# Patient Record
Sex: Female | Born: 2000 | Race: Black or African American | Hispanic: No | Marital: Single | Smoking: Never smoker
Health system: Southern US, Community
[De-identification: ages and names within clinical notes are randomized; demographics above are authoritative.]

## PROBLEM LIST (undated history)

## (undated) ENCOUNTER — Ambulatory Visit: Payer: Medicaid Other

## (undated) ENCOUNTER — Ambulatory Visit: Admission: EM | Discharge status: Absent without leave | Payer: Medicaid Other

## (undated) DIAGNOSIS — J45909 Unspecified asthma, uncomplicated: Secondary | ICD-10-CM

## (undated) DIAGNOSIS — J302 Other seasonal allergic rhinitis: Secondary | ICD-10-CM

## (undated) HISTORY — PX: TONSILLECTOMY: SUR1361

## (undated) HISTORY — PX: ADENOIDECTOMY: SUR15

---
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
## 2021-04-11 ENCOUNTER — Other Ambulatory Visit: Payer: Self-pay

## 2021-04-11 ENCOUNTER — Ambulatory Visit
Admission: EM | Admit: 2021-04-11 | Discharge: 2021-04-11 | Disposition: A | Discharge status: Home / Self Care | Payer: BC Managed Care – PPO | Attending: Family Medicine | Admitting: Family Medicine

## 2021-04-11 ENCOUNTER — Encounter: Payer: Self-pay | Admitting: Emergency Medicine

## 2021-04-11 DIAGNOSIS — S86001A Unspecified injury of right Achilles tendon, initial encounter: Secondary | ICD-10-CM

## 2021-04-11 NOTE — ED Triage Notes (Addendum)
Was pulling a heavy cart at work behind her when it ran onto the back of her right ankle/heel yesterday. Visible swelling over achilles tendon area, painful active ROM, decreased dorsiflexion d/t pain. Able to walk with little gait abnormality

## 2021-04-11 NOTE — Discharge Instructions (Addendum)
I have ordered a right foot x-ray and attached directions for obtaining this x-ray to your paperwork today.  We will call you with the results if you choose to go get the x-ray taken.  Do not believe that anything is fractured in your foot, but if you would like the x-ray to confirm given the numerous areas of pain on exam, this order has been placed and is available to you.  Take ibuprofen and Tylenol as needed for pain, rest the foot, ice the foot and follow-up if your symptoms are worsening at any time

## 2021-04-11 NOTE — ED Provider Notes (Signed)
[facility]    CSN: [id number] Arrival date & time: 04/11/21  1615      History   Chief Complaint Chief Complaint  Patient presents with   Ankle Injury    HPI Rhonda Sims is a 20 y.o. female.   Patient presenting today with 1 day history of right heel, foot pain since yesterday when a metal cart ran into the back of her foot.  States the area has been swollen, painful, tingly since the incident.  She is able to bear weight on the foot and walk but does limp and has significant pain with walking.  Denies discoloration, numbness, deformity, history of orthopedic injuries to this area.  Took some ibuprofen and has been icing the area with no relief.   Past Medical History:  Diagnosis Date   Asthma    Seasonal allergies     Patient Active Problem List   Diagnosis Date Noted   Obesity due to excess calories without serious comorbidity with body mass index (BMI) in 95th to 98th percentile for age in pediatric patient 06/29/2018   Vitamin D deficiency 06/29/2018   Acanthosis nigricans 06/29/2018    Past Surgical History:  Procedure Laterality Date   ADENOIDECTOMY     TONSILLECTOMY      OB History   No obstetric history on file.      Home Medications    Prior to Admission medications   Medication Sig Start Date End Date Taking? Authorizing Provider  cetirizine (ZYRTEC) 5 MG tablet Take 5 mg by mouth daily.    [provider]  diphenhydrAMINE (BENADRYL) 12.5 MG/5ML elixir Take by mouth 4 (four) times daily as needed.    [provider]  ergocalciferol (VITAMIN D2) 50000 units capsule Take 1 capsule (50,000 Units total) by mouth once a week. 06/30/18   Gretchen Short, NP  ibuprofen (ADVIL,MOTRIN) 600 MG tablet Take 1 tablet (600 mg total) by mouth every 6 (six) hours as needed for mild pain. Patient not taking: Reported on 06/29/2018 06/15/14   Marcellina Millin, MD    Family History Family History  Problem Relation Age of Onset    Stroke Maternal Grandmother    Hypertension Maternal Grandmother    Hyperlipidemia Maternal Grandmother    Diabetes type II Maternal Grandmother    Hypertension Paternal Grandmother    Heart disease Paternal Grandmother     Social History Social History   Tobacco Use   Smoking status: Passive Smoke Exposure - Never Smoker   Smokeless tobacco: Never   Tobacco comments:    mom smokes outside  Substance Use Topics   Alcohol use: No   Drug use: No     Allergies   Peanut-containing drug products   Review of Systems Review of Systems Per HPI  Physical Exam Triage Vital Signs ED Triage Vitals  Enc Vitals Group     BP 04/11/21 1733 (!) 103/37     Pulse Rate 04/11/21 1733 72     Resp 04/11/21 1733 16     Temp 04/11/21 1733 97.8 F (36.6 C)     Temp Source 04/11/21 1733 Oral     SpO2 04/11/21 1733 96 %     Weight --      Height --      Head Circumference --      Peak Flow --      Pain Score 04/11/21 1740 6     Pain Loc --      Pain Edu? --  Excl. in GC? --    No data found.  Updated Vital Signs BP (!) 120/52 (BP Location: Left Arm)   Pulse 72   Temp 97.8 F (36.6 C) (Oral)   Resp 16   LMP 03/28/2021   SpO2 96%   Visual Acuity Right Eye Distance:   Left Eye Distance:   Bilateral Distance:    Right Eye Near:   Left Eye Near:    Bilateral Near:     Physical Exam Vitals and nursing note reviewed.  Constitutional:      Appearance: Normal appearance. She is not ill-appearing.  HENT:     Head: Atraumatic.  Eyes:     Extraocular Movements: Extraocular movements intact.     Conjunctiva/sclera: Conjunctivae normal.  Cardiovascular:     Rate and Rhythm: Normal rate and regular rhythm.     Heart sounds: Normal heart sounds.  Pulmonary:     Effort: Pulmonary effort is normal.     Breath sounds: Normal breath sounds.  Musculoskeletal:        General: Swelling, tenderness and signs of injury present. No deformity. Normal range of motion.      Cervical back: Normal range of motion and neck supple.     Comments: Diffuse trace edema to right Achilles, tenderness to palpation at insertion into the gastrocnemius and calcaneal region.  Patient also endorses tenderness when palpating along medial edge of foot, dorsal aspect of foot.  Range of motion full and intact foot and ankle, though dorsiflexion is eliciting pain  Skin:    General: Skin is warm and dry.     Findings: No bruising or erythema.  Neurological:     Mental Status: She is alert. Mental status is at baseline.     Comments: Right lower extremity neurovascularly intact  Psychiatric:        Mood and Affect: Mood normal.        Thought Content: Thought content normal.        Judgment: Judgment normal.     UC Treatments / Results  Labs (all labs ordered are listed, but only abnormal results are displayed) Labs Reviewed - No data to display  EKG   Radiology No results found.  Procedures Procedures (including critical care time)  Medications Ordered in UC Medications - No data to display  Initial Impression / Assessment and Plan / UC Course  I have reviewed the triage vital signs and the nursing notes.  Pertinent labs & imaging results that were available during my care of the patient were reviewed by me and considered in my medical decision making (see chart for details).     Suspect Achilles strain, no evidence of a full rupture and able to bear weight with fairly normal gait.  Low suspicion for a fracture of the foot or ankle at this time, but will order a right foot x-ray in case pain persisting longer than anticipated.  Instructions given on how patient can obtain his x-ray imaging and we will call him with results once they are available.  Continue over-the-counter pain relievers, RICE protocol.  Work note given.  Follow-up with sports medicine if not resolving.  Final Clinical Impressions(s) / UC Diagnoses   Final diagnoses:  Unspecified injury of right  Achilles tendon, initial encounter     Discharge Instructions      I have ordered a right foot x-ray and attached directions for obtaining this x-ray to your paperwork today.  We will call you with the results if you  choose to go get the x-ray taken.  Do not believe that anything is fractured in your foot, but if you would like the x-ray to confirm given the numerous areas of pain on exam, this order has been placed and is available to you.  Take ibuprofen and Tylenol as needed for pain, rest the foot, ice the foot and follow-up if your symptoms are worsening at any time     ED Prescriptions   None    PDMP not reviewed this encounter.   Particia Nearing, [place] 04/11/21 1830

## 2021-06-09 ENCOUNTER — Other Ambulatory Visit: Payer: Self-pay

## 2021-06-09 ENCOUNTER — Ambulatory Visit
Admission: EM | Admit: 2021-06-09 | Discharge: 2021-06-09 | Disposition: A | Discharge status: Home / Self Care | Payer: Medicaid Other | Attending: Urgent Care | Admitting: Urgent Care

## 2021-06-09 ENCOUNTER — Encounter: Payer: Self-pay | Admitting: *Deleted

## 2021-06-09 DIAGNOSIS — R590 Localized enlarged lymph nodes: Secondary | ICD-10-CM

## 2021-06-09 MED ORDER — NAPROXEN 500 MG PO TABS: 500.0000 mg | ORAL_TABLET | Freq: Two times a day (BID) | ORAL | 0 refills | Status: DC

## 2021-06-09 NOTE — ED Triage Notes (Signed)
Pt reports bump below RT ear has been there for 3-4 days.

## 2021-06-09 NOTE — ED Provider Notes (Signed)
[facility]   MRN: [id number] DOB: Aug 31, 2001  Subjective:   Rhonda Sims is a 20 y.o. female presenting for 3 to 4-day history of acute onset sensation of a knot that is painful over the right upper side of her neck near her jaw.  Denies fever, runny or stuffy nose, sore throat, cough, chest pain, shortness of breath, nausea, vomiting, unexplained fevers, weight loss, rashes, fatigue.  Does not want testing as she does not feel sick.  She last had some blood work done in 2021, refer to the results.  All it showed was mild anemia.  Does not have a PCP.  She does have a history of allergic rhinitis, asthma.  No current facility-administered medications for this encounter.  Current Outpatient Medications:    cetirizine (ZYRTEC) 5 MG tablet, Take 5 mg by mouth daily., Disp: , Rfl:    diphenhydrAMINE (BENADRYL) 12.5 MG/5ML elixir, Take by mouth 4 (four) times daily as needed., Disp: , Rfl:    ergocalciferol (VITAMIN D2) 50000 units capsule, Take 1 capsule (50,000 Units total) by mouth once a week., Disp: 12 capsule, Rfl: 1   ibuprofen (ADVIL,MOTRIN) 600 MG tablet, Take 1 tablet (600 mg total) by mouth every 6 (six) hours as needed for mild pain. (Patient not taking: Reported on 06/29/2018), Disp: 30 tablet, Rfl: 0   Allergies  Allergen Reactions   Peanut-Containing Drug Products Itching    Past Medical History:  Diagnosis Date   Asthma    Seasonal allergies      Past Surgical History:  Procedure Laterality Date   ADENOIDECTOMY     TONSILLECTOMY      Family History  Problem Relation Age of Onset   Stroke Maternal Grandmother    Hypertension Maternal Grandmother    Hyperlipidemia Maternal Grandmother    Diabetes type II Maternal Grandmother    Hypertension Paternal Grandmother    Heart disease Paternal Grandmother     Social History   Tobacco Use   Smoking status: Passive Smoke Exposure - Never Smoker   Smokeless tobacco: Never   Tobacco comments:     mom smokes outside  Substance Use Topics   Alcohol use: No   Drug use: No    ROS   Objective:   Vitals: BP 103/71   Pulse 71   Temp 98.4 F (36.9 C)   Resp 18   LMP  (LMP Unknown)   SpO2 98%   Physical Exam Constitutional:      General: She is not in acute distress.    Appearance: Normal appearance. She is well-developed. She is not ill-appearing, toxic-appearing or diaphoretic.  HENT:     Head: Normocephalic and atraumatic.     Right Ear: Tympanic membrane, ear canal and external ear normal. No drainage or tenderness. No middle ear effusion. There is no impacted cerumen. Tympanic membrane is not erythematous.     Left Ear: Tympanic membrane, ear canal and external ear normal. No drainage or tenderness.  No middle ear effusion. There is no impacted cerumen. Tympanic membrane is not erythematous.     Nose: Nose normal. No congestion or rhinorrhea.     Mouth/Throat:     Mouth: Mucous membranes are moist. No oral lesions.     Pharynx: Oropharynx is clear. No pharyngeal swelling, oropharyngeal exudate, posterior oropharyngeal erythema or uvula swelling.     Tonsils: No tonsillar exudate or tonsillar abscesses.  Eyes:     General: No scleral icterus.       Right eye: No  discharge.        Left eye: No discharge.     Extraocular Movements: Extraocular movements intact.     Right eye: Normal extraocular motion.     Left eye: Normal extraocular motion.     Conjunctiva/sclera: Conjunctivae normal.     Pupils: Pupils are equal, round, and reactive to light.  Cardiovascular:     Rate and Rhythm: Normal rate.  Pulmonary:     Effort: Pulmonary effort is normal.  Musculoskeletal:     Cervical back: Normal range of motion and neck supple.  Lymphadenopathy:     Head:     Right side of head: No submental, submandibular, tonsillar, preauricular, posterior auricular or occipital adenopathy.     Left side of head: No submental, submandibular, tonsillar, preauricular, posterior auricular  or occipital adenopathy.     Cervical: Cervical adenopathy present.     Right cervical: Superficial cervical adenopathy (superior, right sided near the end of her jawline) present. No deep or posterior cervical adenopathy.    Left cervical: No superficial, deep or posterior cervical adenopathy.     Upper Body:     Right upper body: No supraclavicular, axillary or epitrochlear adenopathy.     Left upper body: No supraclavicular, axillary or epitrochlear adenopathy.  Skin:    General: Skin is warm and dry.  Neurological:     General: No focal deficit present.     Mental Status: She is alert and oriented to person, place, and time.  Psychiatric:        Mood and Affect: Mood normal.        Behavior: Behavior normal.        Thought Content: Thought content normal.        Judgment: Judgment normal.    Assessment and Plan :   PDMP not reviewed this encounter.  1. Cervical lymphadenopathy     Offered patient testing but she refused.  Discussed lymphadenopathy including etiologies.  Recommended conservative management, naproxen for pain and inflammation. Counseled patient on potential for adverse effects with medications prescribed/recommended today, ER and return-to-clinic precautions discussed, patient verbalized understanding.    Wallis Bamberg, [place] 06/09/21 1542

## 2022-01-02 NOTE — Progress Notes (Signed)
?Subjective:  ? ? Rhonda Sims - 21 y.o. female MRN [id number]  Date of birth: December 06, 2000 ? ?HPI ? ?Rhonda Sims is to establish care and annual physical exam. ? ?Current issues and/or concerns: ?Irregular periods: ?Reports since the pandemic periods have changed. Currently having a period for 3 months. Then will not have a period for 3 months. Reports periods were irregular prior to pandemic but has worsened since then. Was prescribed oral birth control pill which did not help. Not currently taking any birth control.  ? ?2. Stress: ?Reports primarily related to interactions she has with older men. States she rides public transportation (bus). Reports one time while walking home a 20 year-old man asked for her phone number. She became frightened and gave the man her mothers phone number. Reports she tells men that even though she may look older she is only 21 years-old. Reports the men tell her that it is essentially ok. Reports men have followed her trying to talk to her and get her attention. Reports she loves her family but doesn't really consider them a support system. States she prefers to stay alone in her bedroom because she doesn't feel she can be herself around them. Has considered that the world is going to end one day. Reports she isn't afraid to die. Denies thoughts of self-harm, suicidal ideations, and homicidal ideations. Reports she is not interested in counseling or medication as of present. Reports she prefers to fix what's going on in the inside instead of getting addicted to medication.  ? ?3. Weight: ?Reports would like to lose some weigh. Reports doesn't have much of an appetite. Usually eating once daily at nighttime. Walking for exercise when the weather is warm.  ? ? ?  01/07/2022  ?  8:44 AM  ?Depression screen PHQ 2/9  ?Decreased Interest 1  ?Down, Depressed, Hopeless 1  ?PHQ - 2 Score 2  ?Altered sleeping 2  ?Tired, decreased energy 2  ?Change in appetite 2  ?Feeling bad or  failure about yourself  2  ?Trouble concentrating 2  ?Moving slowly or fidgety/restless 1  ?Suicidal thoughts 0  ?PHQ-9 Score 13  ?Difficult doing work/chores Somewhat difficult  ? ? ?ROS per HPI  ? ? ? ?Health Maintenance:  ?Health Maintenance Due  ?Topic Date Due  ? COVID-19 Vaccine (1) Never done  ? HPV VACCINES (1 - 2-dose series) Never done  ? Hepatitis C Screening  Never done  ? TETANUS/TDAP  Never done  ? ? ? ?Past Medical History: ?Patient Active Problem List  ? Diagnosis Date Noted  ? Obesity due to excess calories without serious comorbidity with body mass index (BMI) in 95th to 98th percentile for age in pediatric patient 06/29/2018  ? Vitamin D deficiency 06/29/2018  ? Acanthosis nigricans 06/29/2018  ? ? ? ? ?Social History  ? reports that she has never smoked. She has been exposed to tobacco smoke. She has never used smokeless tobacco. She reports that she does not drink alcohol and does not use drugs.  ? ?Family History  ?family history includes Diabetes type II in her maternal grandmother; Heart disease in her paternal grandmother; Hyperlipidemia in her maternal grandmother; Hypertension in her maternal grandmother and paternal grandmother; Stroke in her maternal grandmother.  ? ?Medications: reviewed and updated ?  ?Objective:  ? Physical Exam ?BP 108/71 (BP Location: Left Arm, Patient Position: Sitting, Cuff Size: Large)   Pulse 83   Temp 98.3 ?F (36.8 ?C)   Resp 18  Ht 5' 4.37" (1.635 m)   Wt 275 lb (124.7 kg)   SpO2 98%   BMI 46.66 kg/m?  ? ?Physical Exam ?HENT:  ?   Head: Normocephalic and atraumatic.  ?   Right Ear: Tympanic membrane, ear canal and external ear normal.  ?   Left Ear: Tympanic membrane, ear canal and external ear normal.  ?   Nose: Nose normal.  ?   Mouth/Throat:  ?   Mouth: Mucous membranes are moist.  ?   Pharynx: Oropharynx is clear.  ?Eyes:  ?   Extraocular Movements: Extraocular movements intact.  ?   Conjunctiva/sclera: Conjunctivae normal.  ?   Pupils: Pupils are  equal, round, and reactive to light.  ?Cardiovascular:  ?   Rate and Rhythm: Normal rate and regular rhythm.  ?   Pulses: Normal pulses.  ?   Heart sounds: Normal heart sounds.  ?Pulmonary:  ?   Effort: Pulmonary effort is normal.  ?   Breath sounds: Normal breath sounds.  ?Chest:  ?   Comments: Patient declined.  ?Abdominal:  ?   General: Bowel sounds are normal.  ?   Palpations: Abdomen is soft.  ?Genitourinary: ?   Comments: Patient declined.  ?Musculoskeletal:     ?   General: Normal range of motion.  ?   Cervical back: Normal range of motion and neck supple.  ?Skin: ?   General: Skin is warm and dry.  ?   Capillary Refill: Capillary refill takes less than 2 seconds.  ?Neurological:  ?   General: No focal deficit present.  ?   Mental Status: She is alert and oriented to person, place, and time.  ?Psychiatric:     ?   Mood and Affect: Mood normal.     ?   Behavior: Behavior normal.  ? ?   ?Assessment & Plan:  ?1. Encounter to establish care: ?2. Annual physical exam: ?- Counseled on 150 minutes of exercise per week as tolerated, healthy eating (including decreased daily intake of saturated fats, cholesterol, added sugars, sodium), STI prevention, and routine healthcare maintenance. ? ?3. Screening for metabolic disorder: ?- TIR44+RXVQ to check kidney function, liver function, and electrolyte balance.  ?- CMP14+EGFR ? ?4. Screening for deficiency anemia: ?- CBC to screen for anemia. ?- CBC ? ?5. Diabetes mellitus screening: ?- Hemoglobin A1c to screen for pre-diabetes/diabetes. ?- Hemoglobin A1c ? ?6. Screening cholesterol level: ?- Lipid panel to screen for high cholesterol.  ?- Lipid panel ? ?7. Thyroid disorder screen: ?- TSH to check thyroid function.  ?- TSH ? ?8. Need for hepatitis C screening test: ?- Hepatitis C antibody to screen for hepatitis C.  ?- Hepatitis C Antibody ? ?9. Encounter for screening for HIV: ?- HIV antibody to screen for human immunodeficiency virus.  ?- HIV antibody (with reflex) ? ?10.  Irregular menses: ?- Referral to Gynecology for further evaluation and management. ?- Ambulatory referral to Gynecology ? ?11. Anxiety and depression: ?- Patient denies thoughts of self-harm, suicidal ideations, homicidal ideations. ?- Patient declined pharmacological therapy.  ?- Patient declined referral to Psychiatry/counseling.  ?- Follow-up with primary provider as scheduled.  ? ? ? ?Patient was given clear instructions to go to Emergency Department or return to medical center if symptoms don't improve, worsen, or new problems develop.The patient verbalized understanding. ? ?I discussed the assessment and treatment plan with the patient. The patient was provided an opportunity to ask questions and all were answered. The patient agreed with the plan and demonstrated  an understanding of the instructions. ?  ?The patient was advised to call back or seek an in-person evaluation if the symptoms worsen or if the condition fails to improve as anticipated. ? ? ? ?Durene Fruits, NP ?01/07/2022, 10:15 AM ?Primary Care at [facility]  ? ?

## 2022-01-07 ENCOUNTER — Ambulatory Visit (INDEPENDENT_AMBULATORY_CARE_PROVIDER_SITE_OTHER): Payer: Medicaid Other | Admitting: Family

## 2022-01-07 ENCOUNTER — Encounter: Payer: Self-pay | Admitting: Family

## 2022-01-07 VITALS — BP 108/71 | HR 83 | Temp 98.3°F | Resp 18 | Ht 64.37 in | Wt 275.0 lb

## 2022-01-07 DIAGNOSIS — Z13228 Encounter for screening for other metabolic disorders: Secondary | ICD-10-CM | POA: Diagnosis not present

## 2022-01-07 DIAGNOSIS — Z7689 Persons encountering health services in other specified circumstances: Secondary | ICD-10-CM | POA: Diagnosis not present

## 2022-01-07 DIAGNOSIS — N926 Irregular menstruation, unspecified: Secondary | ICD-10-CM

## 2022-01-07 DIAGNOSIS — F419 Anxiety disorder, unspecified: Secondary | ICD-10-CM

## 2022-01-07 DIAGNOSIS — Z114 Encounter for screening for human immunodeficiency virus [HIV]: Secondary | ICD-10-CM

## 2022-01-07 DIAGNOSIS — Z0000 Encounter for general adult medical examination without abnormal findings: Secondary | ICD-10-CM

## 2022-01-07 DIAGNOSIS — Z130 Encounter for screening for diseases of the blood and blood-forming organs and certain disorders involving the immune mechanism: Secondary | ICD-10-CM | POA: Diagnosis not present

## 2022-01-07 DIAGNOSIS — Z131 Encounter for screening for diabetes mellitus: Secondary | ICD-10-CM

## 2022-01-07 DIAGNOSIS — F32A Depression, unspecified: Secondary | ICD-10-CM

## 2022-01-07 DIAGNOSIS — Z1159 Encounter for screening for other viral diseases: Secondary | ICD-10-CM

## 2022-01-07 DIAGNOSIS — Z13220 Encounter for screening for lipoid disorders: Secondary | ICD-10-CM

## 2022-01-07 DIAGNOSIS — Z1329 Encounter for screening for other suspected endocrine disorder: Secondary | ICD-10-CM

## 2022-01-07 NOTE — Progress Notes (Signed)
Pt presents to establish care has concerns about menstrual cycle being irregular comes on and stays for 3 months and then goes off and another 3 months it stays off denies any pain or cramping ?Having some stressful issues  ?

## 2022-01-07 NOTE — Patient Instructions (Signed)
Preventive Care 20-21 Years Old, Female Preventive care refers to lifestyle choices and visits with your health care provider that can promote health and wellness. At this stage in your life, you may start seeing a primary care physician instead of a pediatrician for your preventive care. Preventive care visits are also called wellness exams. What can I expect for my preventive care visit? Counseling During your preventive care visit, your health care provider may ask about your: Medical history, including: Past medical problems. Family medical history. Pregnancy history. Current health, including: Menstrual cycle. Method of birth control. Emotional well-being. Home life and relationship well-being. Sexual activity and sexual health. Lifestyle, including: Alcohol, nicotine or tobacco, and drug use. Access to firearms. Diet, exercise, and sleep habits. Sunscreen use. Motor vehicle safety. Physical exam Your health care provider may check your: Height and weight. These may be used to calculate your BMI (body mass index). BMI is a measurement that tells if you are at a healthy weight. Waist circumference. This measures the distance around your waistline. This measurement also tells if you are at a healthy weight and may help predict your risk of certain diseases, such as type 2 diabetes and high blood pressure. Heart rate and blood pressure. Body temperature. Skin for abnormal spots. Breasts. What immunizations do I need?  Vaccines are usually given at various ages, according to a schedule. Your health care provider will recommend vaccines for you based on your age, medical history, and lifestyle or other factors, such as travel or where you work. What tests do I need? Screening Your health care provider may recommend screening tests for certain conditions. This may include: Vision and hearing tests. Lipid and cholesterol levels. Pelvic exam and Pap test. Hepatitis B  test. Hepatitis C test. HIV (human immunodeficiency virus) test. STI (sexually transmitted infection) testing, if you are at risk. Tuberculosis skin test if you have symptoms. BRCA-related cancer screening. This may be done if you have a family history of breast, ovarian, tubal, or peritoneal cancers. Talk with your health care provider about your test results, treatment options, and if necessary, the need for more tests. Follow these instructions at home: Eating and drinking  Eat a healthy diet that includes fresh fruits and vegetables, whole grains, lean protein, and low-fat dairy products. Drink enough fluid to keep your urine pale yellow. Do not drink alcohol if: Your health care provider tells you not to drink. You are pregnant, may be pregnant, or are planning to become pregnant. You are under the legal drinking age. In the [place] the legal drinking age is 21. If you drink alcohol: Limit how much you have to 20-1 drink a day. Know how much alcohol is in your drink. In the [place] one drink equals one 12 oz bottle of beer (355 mL), one 5 oz glass of wine (148 mL), or one 1 oz glass of hard liquor (44 mL). Lifestyle Brush your teeth every morning and night with fluoride toothpaste. Floss one time each day. Exercise for at least 30 minutes 5 or more days of the week. Do not use any products that contain nicotine or tobacco. These products include cigarettes, chewing tobacco, and vaping devices, such as e-cigarettes. If you need help quitting, ask your health care provider. Do not use drugs. If you are sexually active, practice safe sex. Use a condom or other form of protection to prevent STIs. If you do not wish to become pregnant, use a form of birth control. If you plan to become pregnant,   see your health care provider for a prepregnancy visit. Find healthy ways to manage stress, such as: Meditation, yoga, or listening to music. Journaling. Talking to a trusted person. Spending time  with friends and family. Safety Always wear your seat belt while driving or riding in a vehicle. Do not drive: If you have been drinking alcohol. Do not ride with someone who has been drinking. When you are tired or distracted. While texting. If you have been using any mind-altering substances or drugs. Wear a helmet and other protective equipment during sports activities. If you have firearms in your house, make sure you follow all gun safety procedures. Seek help if you have been bullied, physically abused, or sexually abused. Use the internet responsibly to avoid dangers, such as online bullying and online sex predators. What's next? Go to your health care provider once a year for an annual wellness visit. Ask your health care provider how often you should have your eyes and teeth checked. Stay up to date on all vaccines. This information is not intended to replace advice given to you by your health care provider. Make sure you discuss any questions you have with your health care provider. Document Revised: 02/20/2021 Document Reviewed: 02/20/2021 Elsevier Patient Education  2023 Elsevier Inc.  

## 2022-01-08 LAB — TSH: TSH: 3 u[IU]/mL (ref 0.450–4.500)

## 2022-01-08 LAB — CMP14+EGFR
ALT: 8 IU/L (ref 0–32)
AST: 11 IU/L (ref 0–40)
Albumin/Globulin Ratio: 1.5 (ref 1.2–2.2)
Albumin: 4.3 g/dL (ref 3.9–5.0)
Alkaline Phosphatase: 102 IU/L (ref 42–106)
BUN/Creatinine Ratio: 14 (ref 9–23)
BUN: 11 mg/dL (ref 6–20)
Bilirubin Total: <0.2 mg/dL (ref 0.0–1.2)
CO2: 21 mmol/L (ref 20–29)
Calcium: 9.7 mg/dL (ref 8.7–10.2)
Chloride: 104 mmol/L (ref 96–106)
Creatinine, Ser: 0.81 mg/dL (ref 0.57–1.00)
Globulin, Total: 2.9 g/dL (ref 1.5–4.5)
Glucose: 91 mg/dL (ref 70–99)
Potassium: 4.7 mmol/L (ref 3.5–5.2)
Sodium: 141 mmol/L (ref 134–144)
Total Protein: 7.2 g/dL (ref 6.0–8.5)
eGFR: 107 mL/min/{1.73_m2} (ref >59)

## 2022-01-08 LAB — HEMOGLOBIN A1C
Est. average glucose Bld gHb Est-mCnc: 108 mg/dL
Hgb A1c MFr Bld: 5.4 % (ref 4.8–5.6)

## 2022-01-08 LAB — CBC
Hematocrit: 32.5 % — ABNORMAL LOW (ref 34.0–46.6)
Hemoglobin: 9.9 g/dL — ABNORMAL LOW (ref 11.1–15.9)
MCH: 22.1 pg — ABNORMAL LOW (ref 26.6–33.0)
MCHC: 30.5 g/dL — ABNORMAL LOW (ref 31.5–35.7)
MCV: 73 fL — ABNORMAL LOW (ref 79–97)
Platelets: 543 10*3/uL — ABNORMAL HIGH (ref 150–450)
RBC: 4.48 x10E6/uL (ref 3.77–5.28)
RDW: 14.1 % (ref 11.7–15.4)
WBC: 8.8 10*3/uL (ref 3.4–10.8)

## 2022-01-08 LAB — HEPATITIS C ANTIBODY: Hep C Virus Ab: NONREACTIVE

## 2022-01-08 LAB — LIPID PANEL
Chol/HDL Ratio: 3 ratio (ref 0.0–4.4)
Cholesterol, Total: 132 mg/dL (ref 100–169)
HDL: 44 mg/dL (ref >39)
LDL Chol Calc (NIH): 68 mg/dL (ref 0–109)
Triglycerides: 107 mg/dL — ABNORMAL HIGH (ref 0–89)
VLDL Cholesterol Cal: 20 mg/dL (ref 5–40)

## 2022-01-08 LAB — HIV ANTIBODY (ROUTINE TESTING W REFLEX): HIV Screen 4th Generation wRfx: NONREACTIVE

## 2022-01-08 NOTE — Progress Notes (Signed)
-    Kidney function and electrolytes normal.  ?-  Liver function normal.  ?-  Thyroid function normal. ?-  No diabetes.  ?-  Hepatitis C negative.  ?-  HIV negative.  ? ?The following abnormalities are noted:   ?-  Triglycerides, a form of cholesterol, mildly higher than expected. This may increase risk of heart attack and/or stroke. Consider eating more fruits, vegetables, and lean baked meats such as chicken or fish. Moderate intensity exercise at least 150 minutes as tolerated per week may help as well.  ?-  Anemia likely related to irregular periods. ? ?All other values are normal, stable or within acceptable limits. ?Medication changes / Follow up labs / Other changes or recommendations:   ?-   Recheck fasting cholesterol in 3 to 6 months.  ?-  Keep all scheduled appointments with Gynecology for irregular periods and anemia.  ? ?Rhonda Herter, NP 01/08/2022 7:47 AM  ?

## 2022-01-16 ENCOUNTER — Ambulatory Visit
Admission: EM | Admit: 2022-01-16 | Discharge: 2022-01-16 | Disposition: A | Discharge status: Home / Self Care | Payer: Medicaid Other | Attending: Student | Admitting: Student

## 2022-01-16 DIAGNOSIS — J4521 Mild intermittent asthma with (acute) exacerbation: Secondary | ICD-10-CM | POA: Diagnosis not present

## 2022-01-16 DIAGNOSIS — J029 Acute pharyngitis, unspecified: Secondary | ICD-10-CM

## 2022-01-16 LAB — POCT RAPID STREP A (OFFICE): Rapid Strep A Screen: NEGATIVE

## 2022-01-16 MED ORDER — ONDANSETRON 8 MG PO TBDP: 8.0000 mg | ORAL_TABLET | Freq: Three times a day (TID) | ORAL | 0 refills | Status: DC | PRN

## 2022-01-16 MED ORDER — ALBUTEROL SULFATE HFA 108 (90 BASE) MCG/ACT IN AERS: 1.0000 | INHALATION_SPRAY | Freq: Four times a day (QID) | RESPIRATORY_TRACT | 0 refills | Status: DC | PRN

## 2022-01-16 MED ORDER — LIDOCAINE VISCOUS HCL 2 % MT SOLN: 15.0000 mL | OROMUCOSAL | 0 refills | Status: DC | PRN

## 2022-01-16 NOTE — Discharge Instructions (Addendum)
-  Albuterol inhaler as needed for cough, wheezing, shortness of breath, 1 to 2 puffs every 6 hours as needed. ?-For sore throat, use lidocaine mouthwash up to every 4 hours. Make sure not to eat for at least 1 hour after using this, as your mouth will be very numb and you could bite yourself. ?-You can take Tylenol up to 1000 mg 3 times daily, and ibuprofen up to 600 mg 3 times daily with food.  You can take these together, or alternate every 3-4 hours. ?-Continue over-the-counter medications like Nyquil ?-With a virus, you're typically contagious for 5-7 days, or as long as you're having fevers.  ? ?

## 2022-01-16 NOTE — ED Provider Notes (Signed)
?[facility] ? ? ? ?CSN: [id number] ?Arrival date & time: 01/16/22  1313 ? ? ?  ? ?History   ?Chief Complaint ?Chief Complaint  ?Patient presents with  ? Sore Throat  ? ? ?HPI ?Rhonda Sims is a 21 y.o. female presenting with sore throat, headache, nasal congestion, bilateral ear pressure for 3 days.  History of tonsillectomy and childhood asthma, this has been controlled on no inhalers for years.  States sore throat is 4/10 during the day, but 10/10 at night.  Describes ear pressure without hearing changes, dizziness, tinnitus. One episode of bilious vomiting today. She has attempted NyQuil and an over-the-counter cold and flu medicine during the day.  Has not monitored temperature at home, but endorses subjective chills and myalgias. Minimal cough. Denies SOB, CP, dizziness, weakness, trouble swallowing. ? ?HPI ? ?Past Medical History:  ?Diagnosis Date  ? Asthma   ? Seasonal allergies   ? ? ?Patient Active Problem List  ? Diagnosis Date Noted  ? Anxiety and depression 01/07/2022  ? Obesity due to excess calories without serious comorbidity with body mass index (BMI) in 95th to 98th percentile for age in pediatric patient 06/29/2018  ? Vitamin D deficiency 06/29/2018  ? Acanthosis nigricans 06/29/2018  ? ? ?Past Surgical History:  ?Procedure Laterality Date  ? ADENOIDECTOMY    ? TONSILLECTOMY    ? ? ?OB History   ?No obstetric history on file. ?  ? ? ? ?Home Medications   ? ?Prior to Admission medications   ?Medication Sig Start Date End Date Taking? Authorizing Provider  ?albuterol (VENTOLIN HFA) 108 (90 Base) MCG/ACT inhaler Inhale 1-2 puffs into the lungs every 6 (six) hours as needed for wheezing or shortness of breath. 01/16/22  Yes Rhys Martini, PA-C  ?lidocaine (XYLOCAINE) 2 % solution Use as directed 15 mLs in the mouth or throat every 4 (four) hours as needed for mouth pain. Swish/gargle and spit 01/16/22  Yes Rhys Martini, PA-C  ? ? ?Family History ?Family History  ?Problem Relation  Age of Onset  ? Stroke Maternal Grandmother   ? Hypertension Maternal Grandmother   ? Hyperlipidemia Maternal Grandmother   ? Diabetes type II Maternal Grandmother   ? Hypertension Paternal Grandmother   ? Heart disease Paternal Grandmother   ? ? ?Social History ?Social History  ? ?Tobacco Use  ? Smoking status: Never  ?  Passive exposure: Yes  ? Smokeless tobacco: Never  ? Tobacco comments:  ?  mom smokes outside  ?Vaping Use  ? Vaping Use: Never used  ?Substance Use Topics  ? Alcohol use: No  ? Drug use: No  ? ? ? ?Allergies   ?Peanut-containing drug products ? ? ?Review of Systems ?Review of Systems  ?Constitutional:  Positive for chills. Negative for appetite change and fever.  ?HENT:  Positive for congestion and sore throat. Negative for ear pain, rhinorrhea, sinus pressure and sinus pain.   ?Eyes:  Negative for redness and visual disturbance.  ?Respiratory:  Negative for cough, chest tightness, shortness of breath and wheezing.   ?Cardiovascular:  Negative for chest pain and palpitations.  ?Gastrointestinal:  Negative for abdominal pain, constipation, diarrhea, nausea and vomiting.  ?Genitourinary:  Negative for dysuria, frequency and urgency.  ?Musculoskeletal:  Negative for myalgias.  ?Neurological:  Negative for dizziness, weakness and headaches.  ?Psychiatric/Behavioral:  Negative for confusion.   ?All other systems reviewed and are negative. ? ? ?Physical Exam ?Triage Vital Signs ?ED Triage Vitals  ?Enc Vitals Group  ?  BP 01/16/22 1322 116/82  ?   Pulse Rate 01/16/22 1322 100  ?   Resp 01/16/22 1322 18  ?   Temp 01/16/22 1322 98.5 ?F (36.9 ?C)  ?   Temp Source 01/16/22 1322 Oral  ?   SpO2 01/16/22 1322 98 %  ?   Weight --   ?   Height --   ?   Head Circumference --   ?   Peak Flow --   ?   Pain Score 01/16/22 1320 6  ?   Pain Loc --   ?   Pain Edu? --   ?   Excl. in GC? --   ? ?No data found. ? ?Updated Vital Signs ?BP 116/82 (BP Location: Right Arm)   Pulse 100   Temp 98.5 ?F (36.9 ?C) (Oral)   Resp  18   LMP 11/27/2021 (Approximate)   SpO2 98%  ? ?Visual Acuity ?Right Eye Distance:   ?Left Eye Distance:   ?Bilateral Distance:   ? ?Right Eye Near:   ?Left Eye Near:    ?Bilateral Near:    ? ?Physical Exam ?Vitals reviewed.  ?Constitutional:   ?   General: She is not in acute distress. ?   Appearance: Normal appearance. She is not ill-appearing.  ?HENT:  ?   Head: Normocephalic and atraumatic.  ?   Right Ear: Tympanic membrane, ear canal and external ear normal. No tenderness. No middle ear effusion. There is no impacted cerumen. Tympanic membrane is not perforated, erythematous, retracted or bulging.  ?   Left Ear: Tympanic membrane, ear canal and external ear normal. No tenderness.  No middle ear effusion. There is no impacted cerumen. Tympanic membrane is not perforated, erythematous, retracted or bulging.  ?   Nose: Nose normal. No congestion.  ?   Mouth/Throat:  ?   Mouth: Mucous membranes are moist.  ?   Pharynx: Uvula midline. Posterior oropharyngeal erythema present. No oropharyngeal exudate.  ?   Tonsils: 0 on the right. 0 on the left.  ?   Comments: Bright erythema posterior pharynx and uvula. Tonsils are absent. On exam, uvula is midline, she is tolerating her secretions without difficulty, there is no trismus, no drooling. Voice is hoarse, but no muffled or hot potato voice. ? ?Eyes:  ?   Extraocular Movements: Extraocular movements intact.  ?   Pupils: Pupils are equal, round, and reactive to light.  ?Cardiovascular:  ?   Rate and Rhythm: Normal rate and regular rhythm.  ?   Heart sounds: Normal heart sounds.  ?Pulmonary:  ?   Effort: Pulmonary effort is normal.  ?   Breath sounds: Wheezing present. No decreased breath sounds, rhonchi or rales.  ?   Comments: Exam limited due to body habitus. Occ wheezes throughout ?Abdominal:  ?   Palpations: Abdomen is soft.  ?   Tenderness: There is no abdominal tenderness. There is no guarding or rebound.  ?Lymphadenopathy:  ?   Cervical: No cervical adenopathy.   ?   Right cervical: No superficial cervical adenopathy. ?   Left cervical: No superficial cervical adenopathy.  ?Neurological:  ?   General: No focal deficit present.  ?   Mental Status: She is alert and oriented to person, place, and time.  ?Psychiatric:     ?   Mood and Affect: Mood normal.     ?   Behavior: Behavior normal.     ?   Thought Content: Thought content normal.     ?  Judgment: Judgment normal.  ? ? ? ?UC Treatments / Results  ?Labs ?(all labs ordered are listed, but only abnormal results are displayed) ?Labs Reviewed  ?CULTURE, GROUP A STREP [facility])  ?POCT RAPID STREP A (OFFICE)  ? ? ?EKG ? ? ?Radiology ?No results found. ? ?Procedures ?Procedures (including critical care time) ? ?Medications Ordered in UC ?Medications - No data to display ? ?Initial Impression / Assessment and Plan / UC Course  ?I have reviewed the triage vital signs and the nursing notes. ? ?Pertinent labs & imaging results that were available during my care of the patient were reviewed by me and considered in my medical decision making (see chart for details). ? ?  ? ?This patient is a very pleasant 21 y.o. year old female presenting with viral pharyngitis and reactive airway exacerbation. Afebrile, borderline tachy.  ? ?Rapid strep negative, culture sent.  ? ?Viscous lidocaine, albuterol inhaler sent. Continue OTC medications. ? ?ED return precautions discussed. Patient verbalizes understanding and agreement.  ? ? ?Final Clinical Impressions(s) / UC Diagnoses  ? ?Final diagnoses:  ?Viral pharyngitis  ?Mild intermittent reactive airway disease with acute exacerbation  ? ? ? ?Discharge Instructions   ? ?  ?-Albuterol inhaler as needed for cough, wheezing, shortness of breath, 1 to 2 puffs every 6 hours as needed. ?-For sore throat, use lidocaine mouthwash up to every 4 hours. Make sure not to eat for at least 1 hour after using this, as your mouth will be very numb and you could bite yourself. ?-You can take Tylenol up to 1000 mg 3  times daily, and ibuprofen up to 600 mg 3 times daily with food.  You can take these together, or alternate every 3-4 hours. ?-Continue over-the-counter medications like Nyquil ?-With a virus, you're t

## 2022-01-16 NOTE — ED Triage Notes (Addendum)
Patient presents to Urgent Care with complaints of sore throat, ha, sinus pain, otalgia since Tuesday. Patient reports some fevers, pt reports otc cold and cough medications.  ? ?Pmh of asthma pt reports sob and wheezing.  ?No inhaler at home ? ? ? ?

## 2022-01-17 LAB — COVID-19, FLU A+B AND RSV
Influenza A, NAA: NOT DETECTED
Influenza B, NAA: NOT DETECTED
RSV, NAA: NOT DETECTED
SARS-CoV-2, NAA: NOT DETECTED

## 2022-01-18 LAB — CULTURE, GROUP A STREP (THRC)

## 2022-03-13 NOTE — Progress Notes (Deleted)
Patient ID: DYNVER CLEMSON, female    DOB: Jul 22, 2001  MRN: [id number]  CC: Issues with cycle  Subjective: Rhonda Sims is a 21 y.o. female who presents for issues with cycle.   Her concerns today include:  Irregular menses  01/07/2022 HPI Reports since the pandemic periods have changed. Currently having a period for 3 months. Then will not have a period for 3 months. Reports periods were irregular prior to pandemic but has worsened since then. Was prescribed oral birth control pill which did not help. Not currently taking any birth control.   A&P - Referral to Gynecology for further evaluation and management.  Per Arna Medici 01/07/2022 Note:   Placed in [facility]  [address]  [place], [place] [address] [phone]  03/21/2022   Patient Active Problem List   Diagnosis Date Noted   Anxiety and depression 01/07/2022   Obesity due to excess calories without serious comorbidity with body mass index (BMI) in 95th to 98th percentile for age in pediatric patient 06/29/2018   Vitamin D deficiency 06/29/2018   Acanthosis nigricans 06/29/2018     Current Outpatient Medications on File Prior to Visit  Medication Sig Dispense Refill   albuterol (VENTOLIN HFA) 108 (90 Base) MCG/ACT inhaler Inhale 1-2 puffs into the lungs every 6 (six) hours as needed for wheezing or shortness of breath. 1 each 0   lidocaine (XYLOCAINE) 2 % solution Use as directed 15 mLs in the mouth or throat every 4 (four) hours as needed for mouth pain. Swish/gargle and spit 100 mL 0   ondansetron (ZOFRAN-ODT) 8 MG disintegrating tablet Take 1 tablet (8 mg total) by mouth every 8 (eight) hours as needed for nausea or vomiting. 20 tablet 0   No current facility-administered medications on file prior to visit.    Allergies  Allergen Reactions   Peanut-Containing Drug Products Itching    Social History   Socioeconomic History   Marital status: Single    Spouse name: Not on file    Number of children: Not on file   Years of education: Not on file   Highest education level: Not on file  Occupational History   Not on file  Tobacco Use   Smoking status: Never    Passive exposure: Yes   Smokeless tobacco: Never   Tobacco comments:    mom smokes outside  Vaping Use   Vaping Use: Never used  Substance and Sexual Activity   Alcohol use: No   Drug use: No   Sexual activity: Never  Other Topics Concern   Not on file  Social History Narrative   Lives with 2 cousins, brother, and mom   Social Determinants of Health   Financial Resource Strain: Not on file  Food Insecurity: Not on file  Transportation Needs: Not on file  Physical Activity: Not on file  Stress: Not on file  Social Connections: Not on file  Intimate Partner Violence: Not on file    Family History  Problem Relation Age of Onset   Stroke Maternal Grandmother    Hypertension Maternal Grandmother    Hyperlipidemia Maternal Grandmother    Diabetes type II Maternal Grandmother    Hypertension Paternal Grandmother    Heart disease Paternal Grandmother     Past Surgical History:  Procedure Laterality Date   ADENOIDECTOMY     TONSILLECTOMY      ROS: Review of Systems Negative except as stated above  PHYSICAL EXAM: There were no vitals taken  for this visit.  Physical Exam  {female adult master:310786} {female adult master:[id number]}     Latest Ref Rng & Units 01/07/2022    9:39 AM  CMP  Glucose 70 - 99 mg/dL 91   BUN 6 - 20 mg/dL 11   Creatinine 5.36 - 1.00 mg/dL 6.44   Sodium 034 - 742 mmol/L 141   Potassium 3.5 - 5.2 mmol/L 4.7   Chloride 96 - 106 mmol/L 104   CO2 20 - 29 mmol/L 21   Calcium 8.7 - 10.2 mg/dL 9.7   Total Protein 6.0 - 8.5 g/dL 7.2   Total Bilirubin 0.0 - 1.2 mg/dL <5.9   Alkaline Phos 42 - 106 IU/L 102   AST 0 - 40 IU/L 11   ALT 0 - 32 IU/L 8    Lipid Panel     Component Value Date/Time   CHOL 132 01/07/2022 0939   TRIG 107 (H) 01/07/2022 0939   HDL 44  01/07/2022 0939   CHOLHDL 3.0 01/07/2022 0939   LDLCALC 68 01/07/2022 0939    CBC    Component Value Date/Time   WBC 8.8 01/07/2022 0939   WBC 7.9 10/24/2019 1600   RBC 4.48 01/07/2022 0939   RBC 4.22 10/24/2019 1600   HGB 9.9 (L) 01/07/2022 0939   HCT 32.5 (L) 01/07/2022 0939   PLT 543 (H) 01/07/2022 0939   MCV 73 (L) 01/07/2022 0939   MCH 22.1 (L) 01/07/2022 0939   MCH 26.1 10/24/2019 1600   MCHC 30.5 (L) 01/07/2022 0939   MCHC 31.5 10/24/2019 1600   RDW 14.1 01/07/2022 0939   LYMPHSABS 3.3 10/24/2019 1600   MONOABS 0.6 10/24/2019 1600   EOSABS 0.2 10/24/2019 1600   BASOSABS 0.0 10/24/2019 1600    ASSESSMENT AND PLAN:  There are no diagnoses linked to this encounter.   Patient was given the opportunity to ask questions.  Patient verbalized understanding of the plan and was able to repeat key elements of the plan. Patient was given clear instructions to go to Emergency Department or return to medical center if symptoms don't improve, worsen, or new problems develop.The patient verbalized understanding.   No orders of the defined types were placed in this encounter.    Requested Prescriptions    No prescriptions requested or ordered in this encounter    No follow-ups on file.  Rema Fendt, NP

## 2022-03-19 ENCOUNTER — Encounter: Payer: Self-pay | Admitting: Family

## 2022-03-19 ENCOUNTER — Ambulatory Visit (INDEPENDENT_AMBULATORY_CARE_PROVIDER_SITE_OTHER): Payer: Medicaid Other | Admitting: Family

## 2022-03-19 ENCOUNTER — Encounter: Payer: Self-pay | Admitting: *Deleted

## 2022-03-19 ENCOUNTER — Ambulatory Visit: Payer: Self-pay | Admitting: *Deleted

## 2022-03-19 VITALS — BP 100/66 | HR 99 | Temp 98.3°F | Resp 16 | Ht 64.37 in | Wt 255.0 lb

## 2022-03-19 DIAGNOSIS — L309 Dermatitis, unspecified: Secondary | ICD-10-CM | POA: Diagnosis not present

## 2022-03-19 MED ORDER — TRIAMCINOLONE ACETONIDE 0.1 % EX CREA: 1.0000 | TOPICAL_CREAM | Freq: Two times a day (BID) | CUTANEOUS | 2 refills | Status: DC

## 2022-03-19 MED ORDER — TRIAMCINOLONE ACETONIDE 0.5 % EX OINT: 1.0000 | TOPICAL_OINTMENT | Freq: Two times a day (BID) | CUTANEOUS | 2 refills | Status: DC

## 2022-03-19 MED ORDER — CETIRIZINE HCL 10 MG PO TABS: 10.0000 mg | ORAL_TABLET | Freq: Every day | ORAL | 2 refills | Status: DC

## 2022-03-19 NOTE — Progress Notes (Signed)
Patient ID: HATLEY HENEGAR, female    DOB: [birth year]  MRN: [id number]  CC: Rash   Subjective: Nilah Belcourt is a 21 y.o. female who presents for rash.   Her concerns today include:  Began 3 days ago. Noticed after visiting her sister's house and the park. Itching. No additional symptoms. Using Aquaphor without relief.   Patient Active Problem List   Diagnosis Date Noted   Anxiety and depression 01/07/2022   Obesity due to excess calories without serious comorbidity with body mass index (BMI) in 95th to 98th percentile for age in pediatric patient 06/29/2018   Vitamin D deficiency 06/29/2018   Acanthosis nigricans 06/29/2018     Current Outpatient Medications on File Prior to Visit  Medication Sig Dispense Refill   albuterol (VENTOLIN HFA) 108 (90 Base) MCG/ACT inhaler Inhale 1-2 puffs into the lungs every 6 (six) hours as needed for wheezing or shortness of breath. 1 each 0   lidocaine (XYLOCAINE) 2 % solution Use as directed 15 mLs in the mouth or throat every 4 (four) hours as needed for mouth pain. Swish/gargle and spit 100 mL 0   ondansetron (ZOFRAN-ODT) 8 MG disintegrating tablet Take 1 tablet (8 mg total) by mouth every 8 (eight) hours as needed for nausea or vomiting. 20 tablet 0   No current facility-administered medications on file prior to visit.    Allergies  Allergen Reactions   Peanut-Containing Drug Products Itching    Social History   Socioeconomic History   Marital status: Single    Spouse name: Not on file   Number of children: Not on file   Years of education: Not on file   Highest education level: Not on file  Occupational History   Not on file  Tobacco Use   Smoking status: Never    Passive exposure: Yes   Smokeless tobacco: Never   Tobacco comments:    mom smokes outside  Vaping Use   Vaping Use: Never used  Substance and Sexual Activity   Alcohol use: No   Drug use: No   Sexual activity: Never  Other Topics Concern   Not on  file  Social History Narrative   Lives with 2 cousins, brother, and mom   Social Determinants of Health   Financial Resource Strain: Not on file  Food Insecurity: Not on file  Transportation Needs: Not on file  Physical Activity: Not on file  Stress: Not on file  Social Connections: Not on file  Intimate Partner Violence: Not on file    Family History  Problem Relation Age of Onset   Stroke Maternal Grandmother    Hypertension Maternal Grandmother    Hyperlipidemia Maternal Grandmother    Diabetes type II Maternal Grandmother    Hypertension Paternal Grandmother    Heart disease Paternal Grandmother     Past Surgical History:  Procedure Laterality Date   ADENOIDECTOMY     TONSILLECTOMY      ROS: Review of Systems Negative except as stated above  PHYSICAL EXAM: BP 100/66 (BP Location: Left Arm, Patient Position: Sitting, Cuff Size: Large)   Pulse 99   Temp 98.3 F (36.8 C)   Resp 16   Ht 5' 4.37" (1.635 m)   Wt 255 lb (115.7 kg)   SpO2 97%   BMI 43.27 kg/m   Physical Exam HENT:     Head: Normocephalic and atraumatic.  Eyes:     Extraocular Movements: Extraocular movements intact.     Conjunctiva/sclera: Conjunctivae normal.  Pupils: Pupils are equal, round, and reactive to light.  Cardiovascular:     Rate and Rhythm: Normal rate and regular rhythm.     Pulses: Normal pulses.     Heart sounds: Normal heart sounds.  Pulmonary:     Effort: Pulmonary effort is normal.     Breath sounds: Normal breath sounds.  Musculoskeletal:     Cervical back: Normal range of motion and neck supple.  Skin:    General: Skin is warm and dry.     Comments: Generalized eczema.   Neurological:     General: No focal deficit present.     Mental Status: She is alert and oriented to person, place, and time.  Psychiatric:        Mood and Affect: Mood normal.        Behavior: Behavior normal.    ASSESSMENT AND PLAN: 1. Eczema, unspecified type - Triamcinolone ointment  and Cetirizine as prescribed. Counseled on medication adherence and adverse effects.  - Follow-up with primary provider as scheduled.  - cetirizine (ZYRTEC) 10 MG tablet; Take 1 tablet (10 mg total) by mouth daily.  Dispense: 30 tablet; Refill: 2 - triamcinolone ointment (KENALOG) 0.5 %; Apply 1 Application topically 2 (two) times daily.  Dispense: 60 g; Refill: 2   Patient was given the opportunity to ask questions.  Patient verbalized understanding of the plan and was able to repeat key elements of the plan. Patient was given clear instructions to go to Emergency Department or return to medical center if symptoms don't improve, worsen, or new problems develop.The patient verbalized understanding.   Requested Prescriptions   Signed Prescriptions Disp Refills   cetirizine (ZYRTEC) 10 MG tablet 30 tablet 2    Sig: Take 1 tablet (10 mg total) by mouth daily.   triamcinolone ointment (KENALOG) 0.5 % 60 g 2    Sig: Apply 1 Application topically 2 (two) times daily.    Follow-up with primary provider as scheduled.  Rema Fendt, NP

## 2022-03-19 NOTE — Telephone Encounter (Addendum)
  Chief Complaint: rash Symptoms: itching Frequency: constant Pertinent Negatives: Patient denies being in [place] but was outside Disposition: [] ED /[] Urgent Care (no appt availability in office) / [x] Appointment(In office/virtual)/ []  [facility]/ [] Home Care/ [] Refused Recommended Disposition /[] [facility]/ []  Follow-up with PCP Additional Notes: Pt has appt for Friday for something else. The rash just started. I made her an appt for today as she requested and she hung up to start finding a ride. I asked her to call back either way and let me know because I have other options. She did not call back and I was unable to reach her again. Messaged FC but did not hear back.  Pt did attend appt.  Reason for Disposition  SEVERE itching (i.e., interferes with sleep, normal activities or school)  Answer Assessment - Initial Assessment Questions 1. APPEARANCE of RASH: "Describe the rash." (e.g., spots, blisters, raised areas, skin peeling, scaly)     Little bumps 2. SIZE: "How big are the spots?" (e.g., tip of pen, eraser, coin; inches, centimeters)     Real little 3. LOCATION: "Where is the rash located?"     Arms legs, hands 4. COLOR: "What color is the rash?" (Note: It is difficult to assess rash color in people with darker-colored skin. When this situation occurs, simply ask the caller to describe what they see.)     red 5. ONSET: "When did the rash begin?"     yesterday 6. FEVER: "Do you have a fever?" If Yes, ask: "What is your temperature, how was it measured, and when did it start?"     no 7. ITCHING: "Does the rash itch?" If Yes, ask: "How bad is the itch?" (Scale 1-10; or mild, moderate, severe)     itching 8. CAUSE: "What do you think is causing the rash?"     unknown 9. MEDICINE FACTORS: "Have you started any new medicines within the last 2 weeks?" (e.g., antibiotics)      no 10. OTHER SYMPTOMS: "Do you have any other symptoms?" (e.g., dizziness,  headache, sore throat, joint pain)       No dizziness, joint pain 11. PREGNANCY: "Is there any chance you are pregnant?" "When was your last menstrual period?"       no  Protocols used: Rash or Redness - [facility]

## 2022-03-19 NOTE — Progress Notes (Signed)
Pt presents for eczema flare

## 2022-03-21 ENCOUNTER — Ambulatory Visit: Payer: Medicaid Other | Admitting: Family

## 2022-03-21 ENCOUNTER — Ambulatory Visit (INDEPENDENT_AMBULATORY_CARE_PROVIDER_SITE_OTHER): Payer: Medicaid Other

## 2022-03-21 DIAGNOSIS — Z111 Encounter for screening for respiratory tuberculosis: Secondary | ICD-10-CM

## 2022-03-24 DIAGNOSIS — Z111 Encounter for screening for respiratory tuberculosis: Secondary | ICD-10-CM | POA: Diagnosis not present

## 2022-03-24 LAB — TB SKIN TEST
Induration: 0 mm
TB Skin Test: NEGATIVE

## 2022-03-24 NOTE — Addendum Note (Signed)
Addended by: Margorie John on: 03/24/2022 01:59 PM   Modules accepted: Orders

## 2022-03-24 NOTE — Progress Notes (Signed)
Patient came for reading of TB on 03/24/2022.me

## 2022-07-03 ENCOUNTER — Other Ambulatory Visit: Payer: Self-pay

## 2022-07-03 ENCOUNTER — Encounter: Payer: Self-pay | Admitting: Family Medicine

## 2022-07-03 ENCOUNTER — Ambulatory Visit (INDEPENDENT_AMBULATORY_CARE_PROVIDER_SITE_OTHER): Payer: Medicaid Other | Admitting: Family Medicine

## 2022-07-03 VITALS — BP 113/75 | HR 74 | Wt 245.7 lb

## 2022-07-03 DIAGNOSIS — N914 Secondary oligomenorrhea: Secondary | ICD-10-CM

## 2022-07-03 MED ORDER — MEDROXYPROGESTERONE ACETATE 10 MG PO TABS: 10.0000 mg | ORAL_TABLET | Freq: Every day | ORAL | 3 refills | Status: DC

## 2022-07-03 NOTE — Assessment & Plan Note (Addendum)
Likely PCOS - declined blood work today. Lengthy discussion had with pt. to explain PCOS, diagrams and verbal communication used to show impact on ovaries, endometrium, need for endometrial protection, impact on fertility, medical treatments to achieve necessary endpoints.  Should be on cyclical progestin vs. OC's if not trying to get pregnant.  She opted for cyclic progesterone. Should be on Femara or clomid if trying to conceive.

## 2022-07-03 NOTE — Progress Notes (Signed)
   Subjective:    Patient ID: Rhonda Sims is a 21 y.o. female presenting with Menstrual Problem  on 07/03/2022  HPI: Reports that Rhonda Sims is having irregular menstrual cycles and they are heavy and last a long time, up to 3 months. Menarche at age 59. Came monthly lasted 3 days. At age 45 stopped having cycles q month. Rhonda Sims is concerned her iron might below. Rhonda Sims is craving corn starch and laundry soap. Reports urinary incontinence x 1 year.  Review of Systems  Constitutional:  Negative for chills and fever.  Respiratory:  Negative for shortness of breath.   Cardiovascular:  Negative for chest pain.  Gastrointestinal:  Negative for abdominal pain, nausea and vomiting.  Genitourinary:  Negative for dysuria.  Skin:  Negative for rash.      Objective:    BP 113/75   Pulse 74   Wt 245 lb 11.2 oz (111.4 kg)   LMP 04/08/2022 (Approximate)   BMI 41.69 kg/m  Physical Exam Constitutional:      General: Rhonda Sims is not in acute distress.    Appearance: Rhonda Sims is well-developed.  HENT:     Head: Normocephalic and atraumatic.  Eyes:     General: No scleral icterus. Cardiovascular:     Rate and Rhythm: Normal rate.  Pulmonary:     Effort: Pulmonary effort is normal.  Abdominal:     Palpations: Abdomen is soft.  Musculoskeletal:     Cervical back: Neck supple.  Skin:    General: Skin is warm and dry.  Neurological:     Mental Status: Rhonda Sims is alert and oriented to person, place, and time.         Assessment & Plan:   Problem List Items Addressed This Visit       Unprioritized   Secondary oligomenorrhea - Primary    Likely PCOS - declined blood work today. Lengthy discussion had with pt. to explain PCOS, diagrams and verbal communication used to show impact on ovaries, endometrium, need for endometrial protection, impact on fertility, medical treatments to achieve necessary endpoints.  Should be on cyclical progestin vs. OC's if not trying to get pregnant.  Rhonda Sims opted for cyclic  progesterone. Should be on Femara or clomid if trying to conceive.       Relevant Medications   medroxyPROGESTERone (PROVERA) 10 MG tablet    Return in about 3 months (around 10/03/2022).  Donnamae Jude, MD 07/03/2022 1:34 PM

## 2022-07-09 NOTE — BH Specialist Note (Deleted)
Integrated Behavioral Health via Telemedicine Visit  07/09/2022 Rhonda Sims [id number]  Number of Integrated Behavioral Health Clinician visits: No data recorded Session Start time: No data recorded  Session End time: No data recorded Total time in minutes: No data recorded  Referring Provider: *** Patient/Family location: *** [facility] Provider location: *** All persons participating in visit: *** Types of Service: {CHL AMB TYPE OF SERVICE:[phone]}  I connected with Rhonda Sims and/or Rhonda Sims's {family members:20773} via  Telephone or Geologist, engineering  (Video is Caregility application) and verified that I am speaking with the correct person using two identifiers. Discussed confidentiality: {YES/NO:21197}  I discussed the limitations of telemedicine and the availability of in person appointments.  Discussed there is a possibility of technology failure and discussed alternative modes of communication if that failure occurs.  I discussed that engaging in this telemedicine visit, they consent to the provision of behavioral healthcare and the services will be billed under their insurance.  Patient and/or legal guardian expressed understanding and consented to Telemedicine visit: {YES/NO:21197}  Presenting Concerns: Patient and/or family reports the following symptoms/concerns: *** Duration of problem: ***; Severity of problem: {Mild/Moderate/Severe:20260}  Patient and/or Family's Strengths/Protective Factors: {CHL AMB BH PROTECTIVE FACTORS:[phone]}  Goals Addressed: Patient will:  Reduce symptoms of: {IBH Symptoms:21014056}   Increase knowledge and/or ability of: {IBH Patient Tools:21014057}   Demonstrate ability to: {IBH Goals:21014053}  Progress towards Goals: {CHL AMB BH PROGRESS TOWARDS GOALS:[id number]}  Interventions: Interventions utilized:  {IBH Interventions:21014054} Standardized Assessments completed: {IBH  Screening Tools:21014051}  Patient and/or Family Response: ***  Assessment: Patient currently experiencing ***.   Patient may benefit from ***.  Plan: Follow up with behavioral health clinician on : *** Behavioral recommendations: *** Referral(s): {IBH Referrals:21014055}  I discussed the assessment and treatment plan with the patient and/or parent/guardian. They were provided an opportunity to ask questions and all were answered. They agreed with the plan and demonstrated an understanding of the instructions.   They were advised to call back or seek an in-person evaluation if the symptoms worsen or if the condition fails to improve as anticipated.  Caroleen Hamman Zyshawn Bohnenkamp, LCSW

## 2022-07-16 NOTE — BH Specialist Note (Deleted)
Integrated Behavioral Health via Telemedicine Visit  07/16/2022 Ti'Dameyeona BRAYLINN GULDEN [id number]  Number of Integrated Behavioral Health Clinician visits: No data recorded Session Start time: No data recorded  Session End time: No data recorded Total time in minutes: No data recorded  Referring Provider: *** Patient/Family location: *** [facility] Provider location: *** All persons participating in visit: *** Types of Service: {CHL AMB TYPE OF [phone]}  I connected with Ti'Dameyeona A Procell and/or Ti'Dameyeona A Montuori's {family members:20773} via  Telephone or Engineer, civil (consulting)  (Video is Caregility application) and verified that I am speaking with the correct person using two identifiers. Discussed confidentiality: {YES/NO:21197}  I discussed the limitations of telemedicine and the availability of in person appointments.  Discussed there is a possibility of technology failure and discussed alternative modes of communication if that failure occurs.  I discussed that engaging in this telemedicine visit, they consent to the provision of behavioral healthcare and the services will be billed under their insurance.  Patient and/or legal guardian expressed understanding and consented to Telemedicine visit: {YES/NO:21197}  Presenting Concerns: Patient and/or family reports the following symptoms/concerns: *** Duration of problem: ***; Severity of problem: {Mild/Moderate/Severe:20260}  Patient and/or Family's Strengths/Protective Factors: {CHL AMB BH PROTECTIVE FACTORS:[phone]}  Goals Addressed: Patient will:  Reduce symptoms of: {IBH Symptoms:21014056}   Increase knowledge and/or ability of: {IBH Patient Tools:21014057}   Demonstrate ability to: {IBH Goals:21014053}  Progress towards Goals: {CHL AMB BH PROGRESS TOWARDS [phone]}  Interventions: Interventions utilized:  {IBH Interventions:21014054} Standardized Assessments completed: {IBH  Screening Tools:21014051}  Patient and/or Family Response: ***  Assessment: Patient currently experiencing ***.   Patient may benefit from ***.  Plan: Follow up with behavioral health clinician on : *** Behavioral recommendations: *** Referral(s): {IBH Referrals:21014055}  I discussed the assessment and treatment plan with the patient and/or parent/guardian. They were provided an opportunity to ask questions and all were answered. They agreed with the plan and demonstrated an understanding of the instructions.   They were advised to call back or seek an in-person evaluation if the symptoms worsen or if the condition fails to improve as anticipated.  Valetta Close Jidenna Figgs, LCSW

## 2022-11-10 ENCOUNTER — Ambulatory Visit: Payer: Medicaid Other

## 2022-11-11 ENCOUNTER — Ambulatory Visit: Payer: Medicaid Other

## 2022-11-19 ENCOUNTER — Other Ambulatory Visit (HOSPITAL_COMMUNITY)
Admission: RE | Admit: 2022-11-19 | Discharge: 2022-11-19 | Disposition: A | Discharge status: Home / Self Care | Payer: Medicaid Other | Source: Ambulatory Visit | Attending: Family Medicine | Admitting: Family Medicine

## 2022-11-19 ENCOUNTER — Ambulatory Visit (INDEPENDENT_AMBULATORY_CARE_PROVIDER_SITE_OTHER): Payer: Medicaid Other

## 2022-11-19 VITALS — BP 123/68 | HR 85 | Ht 65.0 in | Wt 242.1 lb

## 2022-11-19 DIAGNOSIS — N898 Other specified noninflammatory disorders of vagina: Secondary | ICD-10-CM | POA: Diagnosis present

## 2022-11-19 NOTE — Progress Notes (Signed)
Rhonda Sims is here with concern of potential BV; patient reports that she just tried using a new soap. White discharge, no smell, and no itchiness. These symptoms have been present for 1 week. Patient reports she has tried nothing to resolve symptoms.  Plan of care: Self swab instructions given and specimen obtained; patient wanting testing for BV and yeast only, she declined GC/CT and trich testing when offered along with yeast and BV. Explained patient will be contacted with any abnormal results, and we will send in treatment if needed. Verified pharmacy with patient and she had no other questions or concerns.   Patient is not due for annual exam.  Alinda Money, RN 11/19/2022  8:18 AM

## 2022-11-20 LAB — CERVICOVAGINAL ANCILLARY ONLY
Bacterial Vaginitis (gardnerella): NEGATIVE
Candida Glabrata: NEGATIVE
Candida Vaginitis: NEGATIVE
Comment: NEGATIVE
Comment: NEGATIVE
Comment: NEGATIVE

## 2022-12-10 NOTE — Progress Notes (Unsigned)
Patient ID: Rhonda Sims, female    DOB: 04-30-2001  MRN: [id number]  CC: Knee Pain  Subjective: Rhonda Sims is a 22 y.o. female who presents for knee pain.   Her concerns today include:    Patient Active Problem List   Diagnosis Date Noted   Secondary oligomenorrhea 07/03/2022   Anxiety and depression 01/07/2022   Obesity due to excess calories without serious comorbidity with body mass index (BMI) in 95th to 98th percentile for age in pediatric patient 06/29/2018   Vitamin D deficiency 06/29/2018   Acanthosis nigricans 06/29/2018     Current Outpatient Medications on File Prior to Visit  Medication Sig Dispense Refill   albuterol (VENTOLIN HFA) 108 (90 Base) MCG/ACT inhaler Inhale 1-2 puffs into the lungs every 6 (six) hours as needed for wheezing or shortness of breath. (Patient not taking: Reported on 07/03/2022) 1 each 0   cetirizine (ZYRTEC) 10 MG tablet Take 1 tablet (10 mg total) by mouth daily. (Patient not taking: Reported on 07/03/2022) 30 tablet 2   lidocaine (XYLOCAINE) 2 % solution Use as directed 15 mLs in the mouth or throat every 4 (four) hours as needed for mouth pain. Swish/gargle and spit (Patient not taking: Reported on 07/03/2022) 100 mL 0   medroxyPROGESTERone (PROVERA) 10 MG tablet Take 1 tablet (10 mg total) by mouth daily. 30 tablet 3   ondansetron (ZOFRAN-ODT) 8 MG disintegrating tablet Take 1 tablet (8 mg total) by mouth every 8 (eight) hours as needed for nausea or vomiting. (Patient not taking: Reported on 07/03/2022) 20 tablet 0   triamcinolone ointment (KENALOG) 0.5 % Apply 1 Application topically 2 (two) times daily. (Patient not taking: Reported on 07/03/2022) 60 g 2   No current facility-administered medications on file prior to visit.    Allergies  Allergen Reactions   Peanut-Containing Drug Products Itching    Social History   Socioeconomic History   Marital status: Single    Spouse name: Not on file   Number of  children: Not on file   Years of education: Not on file   Highest education level: Not on file  Occupational History   Not on file  Tobacco Use   Smoking status: Never    Passive exposure: Yes   Smokeless tobacco: Never   Tobacco comments:    mom smokes outside  Vaping Use   Vaping Use: Never used  Substance and Sexual Activity   Alcohol use: Yes    Comment: socially   Drug use: No   Sexual activity: Never    Birth control/protection: None, Abstinence  Other Topics Concern   Not on file  Social History Narrative   Lives with 2 cousins, brother, and mom   Social Determinants of Health   Financial Resource Strain: Not on file  Food Insecurity: No Food Insecurity (07/03/2022)   Hunger Vital Sign    Worried About Running Out of Food in the Last Year: Never true    Ran Out of Food in the Last Year: Never true  Transportation Needs: No Transportation Needs (07/03/2022)   PRAPARE - Administrator, Civil Service (Medical): No    Lack of Transportation (Non-Medical): No  Physical Activity: Not on file  Stress: Not on file  Social Connections: Not on file  Intimate Partner Violence: Not on file    Family History  Problem Relation Age of Onset   Stroke Maternal Grandmother    Hypertension Maternal Grandmother    Hyperlipidemia Maternal Grandmother  Diabetes type II Maternal Grandmother    Hypertension Paternal Grandmother    Heart disease Paternal Grandmother     Past Surgical History:  Procedure Laterality Date   ADENOIDECTOMY     TONSILLECTOMY     WISDOM TOOTH EXTRACTION Bilateral 2020    ROS: Review of Systems Negative except as stated above  PHYSICAL EXAM: LMP 10/30/2022 (Approximate)   Physical Exam  {female adult master:310786} {female adult master:310785}     Latest Ref Rng & Units 01/07/2022    9:39 AM  CMP  Glucose 70 - 99 mg/dL 91   BUN 6 - 20 mg/dL 11   Creatinine 2.95 - 1.00 mg/dL 6.21   Sodium 308 - 657 mmol/L 141   Potassium  3.5 - 5.2 mmol/L 4.7   Chloride 96 - 106 mmol/L 104   CO2 20 - 29 mmol/L 21   Calcium 8.7 - 10.2 mg/dL 9.7   Total Protein 6.0 - 8.5 g/dL 7.2   Total Bilirubin 0.0 - 1.2 mg/dL <8.4   Alkaline Phos 42 - 106 IU/L 102   AST 0 - 40 IU/L 11   ALT 0 - 32 IU/L 8    Lipid Panel     Component Value Date/Time   CHOL 132 01/07/2022 0939   TRIG 107 (H) 01/07/2022 0939   HDL 44 01/07/2022 0939   CHOLHDL 3.0 01/07/2022 0939   LDLCALC 68 01/07/2022 0939    CBC    Component Value Date/Time   WBC 8.8 01/07/2022 0939   WBC 7.9 10/24/2019 1600   RBC 4.48 01/07/2022 0939   RBC 4.22 10/24/2019 1600   HGB 9.9 (L) 01/07/2022 0939   HCT 32.5 (L) 01/07/2022 0939   PLT 543 (H) 01/07/2022 0939   MCV 73 (L) 01/07/2022 0939   MCH 22.1 (L) 01/07/2022 0939   MCH 26.1 10/24/2019 1600   MCHC 30.5 (L) 01/07/2022 0939   MCHC 31.5 10/24/2019 1600   RDW 14.1 01/07/2022 0939   LYMPHSABS 3.3 10/24/2019 1600   MONOABS 0.6 10/24/2019 1600   EOSABS 0.2 10/24/2019 1600   BASOSABS 0.0 10/24/2019 1600    ASSESSMENT AND PLAN:  There are no diagnoses linked to this encounter.   Patient was given the opportunity to ask questions.  Patient verbalized understanding of the plan and was able to repeat key elements of the plan. Patient was given clear instructions to go to Emergency Department or return to medical center if symptoms don't improve, worsen, or new problems develop.The patient verbalized understanding.   No orders of the defined types were placed in this encounter.    Requested Prescriptions    No prescriptions requested or ordered in this encounter    No follow-ups on file.  Rhonda Fendt, NP

## 2022-12-11 ENCOUNTER — Ambulatory Visit
Admission: EM | Admit: 2022-12-11 | Discharge: 2022-12-11 | Disposition: A | Discharge status: Home / Self Care | Payer: Medicaid Other | Attending: Internal Medicine | Admitting: Internal Medicine

## 2022-12-11 ENCOUNTER — Encounter: Payer: Medicaid Other | Admitting: Family

## 2022-12-11 ENCOUNTER — Ambulatory Visit (INDEPENDENT_AMBULATORY_CARE_PROVIDER_SITE_OTHER): Payer: Medicaid Other

## 2022-12-11 DIAGNOSIS — W19XXXA Unspecified fall, initial encounter: Secondary | ICD-10-CM | POA: Diagnosis not present

## 2022-12-11 DIAGNOSIS — M25562 Pain in left knee: Secondary | ICD-10-CM

## 2022-12-11 MED ORDER — IBUPROFEN 600 MG PO TABS: 600.0000 mg | ORAL_TABLET | Freq: Four times a day (QID) | ORAL | 0 refills | Status: DC | PRN

## 2022-12-11 NOTE — ED Provider Notes (Signed)
[facility]    CSN: [id number] Arrival date & time: 12/11/22  0920      History   Chief Complaint Chief Complaint  Patient presents with   Knee Injury    HPI Rhonda Sims is a 22 y.o. female.   Patient presents to urgent care for evaluation of left knee pain and swelling that started 1 week ago after she fell while at work. 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 Toddler is okay. Patient felt her left knee twist as she fell down landing on her buttocks. She states she heard a pop to the left knee and the knee became significantly swollen afterwards. She did not hit her head when falling and denies pain to the neck, back, abdomen, chest, and feet/ankles. No lacerations/abrasions or LOC.  No previous injury to the left knee.  She has been applying ice to the left knee consistently over the last 1 week and states that has helped significantly with the swelling.  She has been taking ibuprofen and Tylenol intermittently for pain.  Pain is diffuse to the left knee and starts in the front and wraps around to the back.  Describes the pain as an ache that is worse with ambulation.  Ambulating with a limp favoring the left lower extremity but able to bear weight on left lower extremity.  No numbness or tingling distally to injury.     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   Anxiety and depression 01/07/2022   Obesity due to excess calories without serious comorbidity with body mass index (BMI) in 95th to 98th percentile for age in pediatric patient 06/29/2018   Vitamin D deficiency 06/29/2018   Acanthosis nigricans 06/29/2018    Past Surgical History:  Procedure Laterality Date   ADENOIDECTOMY     TONSILLECTOMY     WISDOM TOOTH EXTRACTION Bilateral 2020    OB History      Gravida  0   Para  0   Term  0   Preterm  0   AB  0   Living  0      SAB  0   IAB  0   Ectopic  0   Multiple  0   Live Births  0            Home Medications    Prior to Admission medications   Medication Sig Start Date End Date Taking? Authorizing Provider  ibuprofen (ADVIL) 600 MG tablet Take 1 tablet (600 mg total) by mouth every 6 (six) hours as needed. 12/11/22  Yes Talbot Grumbling, FNP  albuterol (VENTOLIN HFA) 108 (90 Base) MCG/ACT inhaler Inhale 1-2 puffs into the lungs every 6 (six) hours as needed for wheezing or shortness of breath. Patient not taking: Reported on 07/03/2022 01/16/22   Hazel Sams, PA-C  cetirizine (ZYRTEC) 10 MG tablet Take 1 tablet (10 mg total) by mouth daily. Patient not taking: Reported on 07/03/2022 03/19/22   Camillia Herter, NP  lidocaine (XYLOCAINE) 2 % solution Use as directed 15 mLs in the mouth or throat every 4 (four) hours as needed for mouth pain. Swish/gargle and spit Patient not taking: Reported on 07/03/2022 01/16/22   Hazel Sams, PA-C  medroxyPROGESTERone (PROVERA) 10 MG tablet Take 1 tablet (  10 mg total) by mouth daily. 07/03/22   Donnamae Jude, MD  ondansetron (ZOFRAN-ODT) 8 MG disintegrating tablet Take 1 tablet (8 mg total) by mouth every 8 (eight) hours as needed for nausea or vomiting. Patient not taking: Reported on 07/03/2022 01/16/22   Hazel Sams, PA-C  triamcinolone ointment (KENALOG) 0.5 % Apply 1 Application topically 2 (two) times daily. Patient not taking: Reported on 07/03/2022 03/19/22   Camillia Herter, NP    Family History Family History  Problem Relation Age of Onset   Stroke Maternal Grandmother    Hypertension Maternal Grandmother    Hyperlipidemia Maternal Grandmother    Diabetes type II Maternal Grandmother    Hypertension Paternal Grandmother    Heart disease Paternal Grandmother     Social History Social History   Tobacco Use   Smoking status: Never    Passive  exposure: Yes   Smokeless tobacco: Never   Tobacco comments:    mom smokes outside  Vaping Use   Vaping Use: Never used  Substance Use Topics   Alcohol use: Yes    Comment: socially   Drug use: No     Allergies   Peanut-containing drug products   Review of Systems Review of Systems Per HPI  Physical Exam Triage Vital Signs ED Triage Vitals  Enc Vitals Group     BP 12/11/22 0946 123/82     Pulse Rate 12/11/22 0946 79     Resp 12/11/22 0946 16     Temp 12/11/22 0946 98.2 F (36.8 C)     Temp Source 12/11/22 0946 Oral     SpO2 12/11/22 0946 99 %     Weight --      Height --      Head Circumference --      Peak Flow --      Pain Score 12/11/22 0947 7     Pain Loc --      Pain Edu? --      Excl. in [place]? --    No data found.  Updated Vital Signs BP 123/82 (BP Location: Left Arm)   Pulse 79   Temp 98.2 F (36.8 C) (Oral)   Resp 16   LMP 12/04/2022 (Approximate)   SpO2 99%   Visual Acuity Right Eye Distance:   Left Eye Distance:   Bilateral Distance:    Right Eye Near:   Left Eye Near:    Bilateral Near:     Physical Exam Vitals and nursing note reviewed.  Constitutional:      Appearance: She is not ill-appearing or toxic-appearing.  HENT:     Head: Normocephalic and atraumatic.     Right Ear: Hearing and external ear normal.     Left Ear: Hearing and external ear normal.     Nose: Nose normal.     Mouth/Throat:     Lips: Pink.  Eyes:     General: Lids are normal. Vision grossly intact. Gaze aligned appropriately.     Extraocular Movements: Extraocular movements intact.     Conjunctiva/sclera: Conjunctivae normal.  Pulmonary:     Effort: Pulmonary effort is normal.  Musculoskeletal:     Cervical back: Neck supple.     Right knee: Normal.     Left knee: No swelling, deformity, effusion, erythema, ecchymosis, lacerations, bony tenderness or crepitus. Normal range of motion. Tenderness present over the medial joint line, lateral joint line and  patellar tendon. Normal alignment. Normal pulse.  Skin:    General: Skin  is warm and dry.     Capillary Refill: Capillary refill takes less than 2 seconds.     Findings: No rash.  Neurological:     General: No focal deficit present.     Mental Status: She is alert and oriented to person, place, and time. Mental status is at baseline.     Cranial Nerves: No dysarthria or facial asymmetry.  Psychiatric:        Mood and Affect: Mood normal.        Speech: Speech normal.        Behavior: Behavior normal.        Thought Content: Thought content normal.        Judgment: Judgment normal.      UC Treatments / Results  Labs (all labs ordered are listed, but only abnormal results are displayed) Labs Reviewed - No data to display  EKG   Radiology DG Knee Complete 4 Views Left  Result Date: 12/11/2022 CLINICAL DATA:  Trip and fall 1 week ago with persistent knee pain, initial encounter EXAM: LEFT KNEE - COMPLETE 4+ VIEW COMPARISON:  06/15/2014 FINDINGS: No evidence of fracture, dislocation, or joint effusion. No evidence of arthropathy or other focal bone abnormality. Soft tissues are unremarkable. IMPRESSION: No acute abnormality noted. Electronically Signed   By: Inez Catalina M.D.   On: 12/11/2022 10:14    Procedures Procedures (including critical care time)  Medications Ordered in UC Medications - No data to display  Initial Impression / Assessment and Plan / UC Course  I have reviewed the triage vital signs and the nursing notes.  Pertinent labs & imaging results that were available during my care of the patient were reviewed by me and considered in my medical decision making (see chart for details).   1. Acute pain of left knee, fall X-rays negative for acute bony abnormality.  Suspect sprain of the left knee.  Will manage this conservatively with ibuprofen 600 mg every 6 hours as needed for inflammation and pain.  May continue RICE.  Knee brace applied in clinic.  Encouraged to  wear this for compression and stability of the left knee.  Patient is ambulatory with steady gait.  Neurovascularly intact distal to injury.  Walking referral was given to orthopedics should symptoms fail to improve in the next 5 to 7 days with conservative care.  She is agreeable with this plan.  Discussed physical exam and available lab work findings in clinic with patient.  Counseled patient regarding appropriate use of medications and potential side effects for all medications recommended or prescribed today. Discussed red flag signs and symptoms of worsening condition,when to call the PCP office, return to urgent care, and when to seek higher level of care in the emergency department. Patient verbalizes understanding and agreement with plan. All questions answered. Patient discharged in stable condition.    Final Clinical Impressions(s) / UC Diagnoses   Final diagnoses:  Acute pain of left knee  Fall, initial encounter     Discharge Instructions      Your x-rays of your knee were negative for fracture or dislocation. You likely sprained your knee.   Wear the knee brace we provided in the clinic for the next couple of weeks to provide compression, stability, and comfort.  Please rest, ice, and elevate your knee to help it heal and decrease inflammation.   Take 600mg  ibuprofen and/or 1,000mg  tylenol every 6 hours as needed for pain and inflammation. Take with food to avoid stomach upset.  Call the orthopedic provider listed on your discharge paperwork to schedule a follow-up appointment if your symptoms do not improve in the next 1-2 weeks with supportive care.  Return to urgent care if you experience worsening pain, numbness, tingling, change of color in your skin near the injury, or any other concerning symptoms.  I hope you feel better!    ED Prescriptions     Medication Sig Dispense Auth. Provider   ibuprofen (ADVIL) 600 MG tablet Take 1 tablet (600 mg total) by mouth every  6 (six) hours as needed. 30 tablet Talbot Grumbling, FNP      PDMP not reviewed this encounter.   Talbot Grumbling, [place] 12/11/22 1031

## 2022-12-11 NOTE — Discharge Instructions (Addendum)
Your x-rays of your knee were negative for fracture or dislocation. You likely sprained your knee.   Wear the knee brace we provided in the clinic for the next couple of weeks to provide compression, stability, and comfort.  Please rest, ice, and elevate your knee to help it heal and decrease inflammation.   Take 600mg  ibuprofen and/or 1,000mg  tylenol every 6 hours as needed for pain and inflammation. Take with food to avoid stomach upset.  Call the orthopedic provider listed on your discharge paperwork to schedule a follow-up appointment if your symptoms do not improve in the next 1-2 weeks with supportive care.  Return to urgent care if you experience worsening pain, numbness, tingling, change of color in your skin near the injury, or any other concerning symptoms.  I hope you feel better!

## 2022-12-11 NOTE — ED Triage Notes (Signed)
Pt states she tripped and twisted her left knee one week ago.  States she has been icing it and taking ibuprofen at home with some relief. Pt ambulates with a limp.

## 2022-12-24 ENCOUNTER — Ambulatory Visit
Admission: EM | Admit: 2022-12-24 | Discharge: 2022-12-24 | Disposition: A | Discharge status: Home / Self Care | Payer: Medicaid Other | Attending: Family Medicine | Admitting: Family Medicine

## 2022-12-24 VITALS — BP 106/62 | HR 95 | Temp 98.1°F | Resp 17

## 2022-12-24 DIAGNOSIS — J4521 Mild intermittent asthma with (acute) exacerbation: Secondary | ICD-10-CM | POA: Diagnosis not present

## 2022-12-24 DIAGNOSIS — Z1152 Encounter for screening for COVID-19: Secondary | ICD-10-CM | POA: Insufficient documentation

## 2022-12-24 DIAGNOSIS — R059 Cough, unspecified: Secondary | ICD-10-CM | POA: Diagnosis present

## 2022-12-24 DIAGNOSIS — J069 Acute upper respiratory infection, unspecified: Secondary | ICD-10-CM | POA: Diagnosis not present

## 2022-12-24 LAB — POCT URINALYSIS DIP (MANUAL ENTRY)
Bilirubin, UA: NEGATIVE
Blood, UA: NEGATIVE
Glucose, UA: NEGATIVE mg/dL
Ketones, POC UA: NEGATIVE mg/dL
Nitrite, UA: NEGATIVE
Protein Ur, POC: 30 mg/dL — AB
Spec Grav, UA: >=1.03 — AB (ref 1.010–1.025)
Urobilinogen, UA: 0.2 E.U./dL
pH, UA: 6 (ref 5.0–8.0)

## 2022-12-24 LAB — POCT URINE PREGNANCY: Preg Test, Ur: NEGATIVE

## 2022-12-24 LAB — POCT INFLUENZA A/B
Influenza A, POC: NEGATIVE
Influenza B, POC: NEGATIVE

## 2022-12-24 MED ORDER — PROMETHAZINE-DM 6.25-15 MG/5ML PO SYRP: 5.0000 mL | ORAL_SOLUTION | Freq: Four times a day (QID) | ORAL | 0 refills | Status: DC | PRN

## 2022-12-24 MED ORDER — PREDNISONE 20 MG PO TABS: 40.0000 mg | ORAL_TABLET | Freq: Every day | ORAL | 0 refills | Status: DC

## 2022-12-24 NOTE — ED Triage Notes (Signed)
Pt presents with cough, congestion, generalized body aches, headache, and vomiting for past few days.

## 2022-12-24 NOTE — Discharge Instructions (Addendum)
Your pregnancy test was negative. Your influenza test was negative. Your urine test showed you are dehydrated. Do your best to ensure adequate fluid intake.  You have been tested for COVID-19 today. If your test returns positive, you will receive a phone call from [facility] regarding your results. Negative test results are not called. Both positive and negative results area always visible on MyChart. If you do not have a MyChart account, sign up instructions are provided in your discharge papers. Please do not hesitate to contact us should you have questions or concerns.

## 2022-12-25 LAB — SARS CORONAVIRUS 2 (TAT 6-24 HRS): SARS Coronavirus 2: NEGATIVE

## 2022-12-27 NOTE — ED Provider Notes (Signed)
[facility]   [id number] 12/24/22 Arrival Time: 0827  ASSESSMENT & PLAN:  1. Viral URI with cough   2. Mild intermittent asthma with acute exacerbation    Results for orders placed or performed during the hospital encounter of 12/24/22  SARS CORONAVIRUS 2 (TAT 6-24 HRS) Anterior Nasal Swab   Specimen: Anterior Nasal Swab  Result Value Ref Range   SARS Coronavirus 2 NEGATIVE NEGATIVE  POCT Influenza A/B  Result Value Ref Range   Influenza A, POC Negative Negative   Influenza B, POC Negative Negative  POCT urinalysis dipstick  Result Value Ref Range   Color, UA yellow yellow   Clarity, UA cloudy (A) clear   Glucose, UA negative negative mg/dL   Bilirubin, UA negative negative   Ketones, POC UA negative negative mg/dL   Spec Grav, UA >=3.244 (A) 1.010 - 1.025   Blood, UA negative negative   pH, UA 6.0 5.0 - 8.0   Protein Ur, POC =30 (A) negative mg/dL   Urobilinogen, UA 0.2 0.2 or 1.0 E.U./dL   Nitrite, UA Negative Negative   Leukocytes, UA Trace (A) Negative  POCT urine pregnancy  Result Value Ref Range   Preg Test, Ur Negative Negative   No respiratory distress. OTC symptom care as needed.  Discharge Medication List as of 12/24/2022  9:48 AM     START taking these medications   Details  predniSONE (DELTASONE) 20 MG tablet Take 2 tablets (40 mg total) by mouth daily., Starting Wed 12/24/2022, Normal    promethazine-dextromethorphan (PROMETHAZINE-DM) 6.25-15 MG/5ML syrup Take 5 mLs by mouth 4 (four) times daily as needed for cough., Starting Wed 12/24/2022, Normal         Follow-up Information     Rema Fendt, NP.   Specialty: Nurse Practitioner Why: If worsening or failing to improve as anticipated. Contact information: [address] [place] [address] [phone]                 Follow-up Information     Rema Fendt, NP.   Specialty: Nurse Practitioner Why: If worsening or failing to improve as  anticipated. Contact information: [address] [place] [address] [phone]                 Reviewed expectations re: course of current medical issues. Questions answered. Outlined signs and symptoms indicating need for more acute intervention. Understanding verbalized. After Visit Summary given.   SUBJECTIVE: History from: Patient. Zunaira A Shadd is a 22 y.o. female. Reports: Pt presents with cough, congestion, generalized body aches, headache, and vomiting for past few days. Wheezing with asthma exacerbation; sporadic. Denies current CP/SOB. Denies fever. Normal PO intake without n/v/d. Requests COVID testing.  OBJECTIVE:  Vitals:   12/24/22 0905  BP: 106/62  Pulse: 95  Resp: 17  Temp: 98.1 F (36.7 C)  TempSrc: Oral  SpO2: 96%    General appearance: alert; no distress Eyes: PERRLA; EOMI; conjunctiva normal HENT: [place]; AT; with nasal congestion Neck: supple  Lungs: speaks full sentences without difficulty; unlabored; bilat exp wheezing Extremities: no edema Skin: warm and dry Neurologic: normal gait Psychological: alert and cooperative; normal mood and affect  Labs: Results for orders placed or performed during the hospital encounter of 12/24/22  SARS CORONAVIRUS 2 (TAT 6-24 HRS) Anterior Nasal Swab   Specimen: Anterior Nasal Swab  Result Value Ref Range   SARS Coronavirus 2 NEGATIVE NEGATIVE  POCT Influenza A/B  Result Value Ref Range  Influenza A, POC Negative Negative   Influenza B, POC Negative Negative  POCT urinalysis dipstick  Result Value Ref Range   Color, UA yellow yellow   Clarity, UA cloudy (A) clear   Glucose, UA negative negative mg/dL   Bilirubin, UA negative negative   Ketones, POC UA negative negative mg/dL   Spec Grav, UA >=7.564 (A) 1.010 - 1.025   Blood, UA negative negative   pH, UA 6.0 5.0 - 8.0   Protein Ur, POC =30 (A) negative mg/dL   Urobilinogen, UA 0.2 0.2 or 1.0 E.U./dL   Nitrite, UA  Negative Negative   Leukocytes, UA Trace (A) Negative  POCT urine pregnancy  Result Value Ref Range   Preg Test, Ur Negative Negative   Labs Reviewed  POCT URINALYSIS DIP (MANUAL ENTRY) - Abnormal; Notable for the following components:      Result Value   Clarity, UA cloudy (*)    Spec Grav, UA >=1.030 (*)    Protein Ur, POC =30 (*)    Leukocytes, UA Trace (*)    All other components within normal limits  SARS CORONAVIRUS 2 (TAT 6-24 HRS)  POCT INFLUENZA A/B  POCT URINE PREGNANCY    Allergies  Allergen Reactions   Peanut-Containing Drug Products Itching    Past Medical History:  Diagnosis Date   Asthma    Seasonal allergies    Social History   Socioeconomic History   Marital status: Single    Spouse name: Not on file   Number of children: Not on file   Years of education: Not on file   Highest education level: Not on file  Occupational History   Not on file  Tobacco Use   Smoking status: Never    Passive exposure: Yes   Smokeless tobacco: Never   Tobacco comments:    mom smokes outside  Vaping Use   Vaping Use: Never used  Substance and Sexual Activity   Alcohol use: Yes    Comment: socially   Drug use: No   Sexual activity: Never    Birth control/protection: None, Abstinence  Other Topics Concern   Not on file  Social History Narrative   Lives with 2 cousins, brother, and mom   Social Determinants of Health   Financial Resource Strain: Not on file  Food Insecurity: No Food Insecurity (07/03/2022)   Hunger Vital Sign    Worried About Running Out of Food in the Last Year: Never true    Ran Out of Food in the Last Year: Never true  Transportation Needs: No Transportation Needs (07/03/2022)   PRAPARE - Administrator, Civil Service (Medical): No    Lack of Transportation (Non-Medical): No  Physical Activity: Not on file  Stress: Not on file  Social Connections: Not on file  Intimate Partner Violence: Not on file   Family History   Problem Relation Age of Onset   Stroke Maternal Grandmother    Hypertension Maternal Grandmother    Hyperlipidemia Maternal Grandmother    Diabetes type II Maternal Grandmother    Hypertension Paternal Grandmother    Heart disease Paternal Grandmother    Past Surgical History:  Procedure Laterality Date   ADENOIDECTOMY     TONSILLECTOMY     WISDOM TOOTH EXTRACTION Bilateral 2020     Mardella Layman, MD 12/27/22 [phone]

## 2022-12-30 ENCOUNTER — Ambulatory Visit: Payer: Medicaid Other | Admitting: Family

## 2023-02-23 ENCOUNTER — Telehealth: Payer: Self-pay | Admitting: Family

## 2023-02-23 NOTE — Telephone Encounter (Signed)
Called pt and could not leave a vm. Replied back on MyChart to call office back for scheduling appt.

## 2023-02-23 NOTE — Progress Notes (Unsigned)
Erroneous encounter-disregard

## 2023-02-24 ENCOUNTER — Encounter: Payer: Medicaid Other | Admitting: Family

## 2023-02-24 ENCOUNTER — Ambulatory Visit
Admission: EM | Admit: 2023-02-24 | Discharge: 2023-02-24 | Disposition: A | Discharge status: Home / Self Care | Payer: Medicaid Other

## 2023-02-24 DIAGNOSIS — L42 Pityriasis rosea: Secondary | ICD-10-CM | POA: Diagnosis not present

## 2023-02-24 NOTE — Discharge Instructions (Addendum)
The rash that you have is called pityriasis rosea.  This is an itchy rash that will resolve in 3 to 4 weeks without treatment you can take oral Benadryl to help with the itching.  Topical Benadryl may help.  Return if any problems

## 2023-02-24 NOTE — ED Triage Notes (Signed)
Here for itchy rash on her stomach and shoulder x 3 days.

## 2023-02-24 NOTE — ED Provider Notes (Signed)
[facility]    CSN: [id number] Arrival date & time: 02/24/23  1246      History   Chief Complaint Chief Complaint  Patient presents with   Rash    HPI Rhonda Sims is a 22 y.o. female.   Patient complains of a rash full body.  The history is provided by the patient. No language interpreter was used.  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   Anxiety and depression 01/07/2022   Obesity due to excess calories without serious comorbidity with body mass index (BMI) in 95th to 98th percentile for age in pediatric patient 06/29/2018   Vitamin D deficiency 06/29/2018   Acanthosis nigricans 06/29/2018    Past Surgical History:  Procedure Laterality Date   ADENOIDECTOMY     TONSILLECTOMY     WISDOM TOOTH EXTRACTION Bilateral 2020    OB History     Gravida  0   Para  0   Term  0   Preterm  0   AB  0   Living  0      SAB  0   IAB  0   Ectopic  0   Multiple  0   Live Births  0            Home Medications    Prior to Admission medications   Medication Sig Start Date End Date Taking? Authorizing Provider  albuterol (VENTOLIN HFA) 108 (90 Base) MCG/ACT inhaler Inhale 1-2 puffs into the lungs every 6 (six) hours as needed for wheezing or shortness of breath. Patient not taking: Reported on 07/03/2022 01/16/22   Rhys Martini, PA-C  cetirizine (ZYRTEC) 10 MG tablet Take 1 tablet (10 mg total) by mouth daily. Patient not taking: Reported on 07/03/2022 03/19/22   Rema Fendt, NP  ibuprofen (ADVIL) 600 MG tablet Take 1 tablet (600 mg total) by mouth every 6 (six) hours as needed. 12/11/22   Carlisle Beers, FNP  lidocaine (XYLOCAINE) 2 %  solution Use as directed 15 mLs in the mouth or throat every 4 (four) hours as needed for mouth pain. Swish/gargle and spit Patient not taking: Reported on 07/03/2022 01/16/22   Rhys Martini, PA-C  medroxyPROGESTERone (PROVERA) 10 MG tablet Take 1 tablet (10 mg total) by mouth daily. 07/03/22   Reva Bores, MD  ondansetron (ZOFRAN-ODT) 8 MG disintegrating tablet Take 1 tablet (8 mg total) by mouth every 8 (eight) hours as needed for nausea or vomiting. Patient not taking: Reported on 07/03/2022 01/16/22   Rhys Martini, PA-C  predniSONE (DELTASONE) 20 MG tablet Take 2 tablets (40 mg total) by mouth daily. 12/24/22   Mardella Layman, MD  promethazine-dextromethorphan (PROMETHAZINE-DM) 6.25-15 MG/5ML syrup Take 5 mLs by mouth 4 (four) times daily as needed for cough. 12/24/22   Mardella Layman, MD  triamcinolone ointment (KENALOG) 0.5 % Apply 1 Application topically 2 (two) times daily. Patient not taking: Reported on 07/03/2022 03/19/22   Rema Fendt, NP    Family History Family History  Problem Relation Age of Onset   Stroke Maternal Grandmother    Hypertension Maternal Grandmother    Hyperlipidemia Maternal Grandmother    Diabetes  type II Maternal Grandmother    Hypertension Paternal Grandmother    Heart disease Paternal Grandmother     Social History Social History   Tobacco Use   Smoking status: Never    Passive exposure: Yes   Smokeless tobacco: Never   Tobacco comments:    mom smokes outside  Vaping Use   Vaping Use: Never used  Substance Use Topics   Alcohol use: Yes    Comment: socially   Drug use: No     Allergies   Peanut-containing drug products   Review of Systems Review of Systems  Skin:  Positive for rash.  All other systems reviewed and are negative.    Physical Exam Triage Vital Signs ED Triage Vitals  Enc Vitals Group     BP 02/24/23 1358 (!) 119/58     Pulse Rate 02/24/23 1357 93     Resp 02/24/23 1358 18     Temp 02/24/23 1357 98.3 F  (36.8 C)     Temp Source 02/24/23 1357 Oral     SpO2 02/24/23 1358 98 %     Weight --      Height --      Head Circumference --      Peak Flow --      Pain Score 02/24/23 1442 0     Pain Loc --      Pain Edu? --      Excl. in GC? --    No data found.  Updated Vital Signs BP (!) 119/58 (BP Location: Left Arm)   Pulse 93   Temp 98.3 F (36.8 C) (Oral)   Resp 18   SpO2 98%   Visual Acuity Right Eye Distance:   Left Eye Distance:   Bilateral Distance:    Right Eye Near:   Left Eye Near:    Bilateral Near:     Physical Exam Vitals reviewed.  Constitutional:      Appearance: Normal appearance.  Cardiovascular:     Rate and Rhythm: Normal rate.  Pulmonary:     Effort: Pulmonary effort is normal.  Skin:    Findings: Erythema and rash present.     Comments: Erythematous rash herald patch right side  Neurological:     General: No focal deficit present.     Mental Status: She is alert.  Psychiatric:        Mood and Affect: Mood normal.      UC Treatments / Results  Labs (all labs ordered are listed, but only abnormal results are displayed) Labs Reviewed - No data to display  EKG   Radiology No results found.  Procedures Procedures (including critical care time)  Medications Ordered in UC Medications - No data to display  Initial Impression / Assessment and Plan / UC Course  I have reviewed the triage vital signs and the nursing notes.  Pertinent labs & imaging results that were available during my care of the patient were reviewed by me and considered in my medical decision making (see chart for details).      Final Clinical Impressions(s) / UC Diagnoses   Final diagnoses:  Pityriasis rosea     Discharge Instructions      The rash that you have is called pityriasis rosea.  This is an itchy rash that will resolve in 3 to 4 weeks without treatment you can take oral Benadryl to help with the itching.  Topical Benadryl may help.  Return if any  problems   ED Prescriptions  None    PDMP not reviewed this encounter. An After Visit Summary was printed and given to the patient.       Elson Areas, [place] 02/24/23 1610

## 2023-05-19 ENCOUNTER — Encounter: Payer: Self-pay | Admitting: Family

## 2023-05-19 ENCOUNTER — Ambulatory Visit: Payer: Medicaid Other | Admitting: Family

## 2023-05-19 VITALS — BP 113/76 | HR 80 | Temp 98.6°F | Ht 65.0 in | Wt 255.0 lb

## 2023-05-19 DIAGNOSIS — E669 Obesity, unspecified: Secondary | ICD-10-CM | POA: Diagnosis not present

## 2023-05-19 DIAGNOSIS — Z7689 Persons encountering health services in other specified circumstances: Secondary | ICD-10-CM

## 2023-05-19 DIAGNOSIS — R21 Rash and other nonspecific skin eruption: Secondary | ICD-10-CM

## 2023-05-19 DIAGNOSIS — Z6841 Body Mass Index (BMI) 40.0 and over, adult: Secondary | ICD-10-CM | POA: Diagnosis not present

## 2023-05-19 MED ORDER — TRIAMCINOLONE ACETONIDE 0.5 % EX OINT
1.0000 | TOPICAL_OINTMENT | Freq: Two times a day (BID) | CUTANEOUS | 1 refills | Status: DC
Start: 2023-05-19 — End: 2024-01-16

## 2023-05-19 MED ORDER — PHENTERMINE HCL 15 MG PO CAPS: 15.0000 mg | ORAL_CAPSULE | ORAL | 0 refills | Status: DC

## 2023-05-19 NOTE — Progress Notes (Signed)
Patient ID: Rhonda Sims, female    DOB: [birth year]  MRN: [id number]  CC: Weight Management  Subjective: Rhonda Sims is a 22 y.o. female who presents for weight management.   Her concerns today include:  - Reports would like to lose about 50 pounds. Reports she is monitoring what she eats. She does exercise routinely. She would like to try a weight loss medication.  - Rash with itching of right forearm began after washing dishes. She denies additional symptoms.   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 Swish/gargle and spit (Patient not taking: Reported on 07/03/2022) 100 mL 0   medroxyPROGESTERone (PROVERA) 10 MG tablet Take 1 tablet (10 mg total) by mouth daily. (Patient not taking: Reported on 05/19/2023) 30 tablet 3   ondansetron (ZOFRAN-ODT) 8 MG disintegrating tablet Take 1 tablet (8 mg total) by mouth every 8 (eight) hours as needed for nausea or vomiting. (Patient not taking: Reported on 07/03/2022) 20 tablet 0   predniSONE (DELTASONE) 20 MG tablet Take 2 tablets (40 mg total) by mouth daily. (Patient not taking: Reported on 05/19/2023) 10 tablet 0   promethazine-dextromethorphan (PROMETHAZINE-DM) 6.25-15 MG/5ML syrup Take 5 mLs by mouth 4 (four) times daily as needed for cough. (Patient not taking: Reported on 05/19/2023) 118 mL 0   No current facility-administered medications on file prior to visit.    Allergies  Allergen Reactions    Peanut-Containing Drug Products Itching    Social History   Socioeconomic History   Marital status: Single    Spouse name: Not on file   Number of children: Not on file   Years of education: Not on file   Highest education level: Not on file  Occupational History   Not on file  Tobacco Use   Smoking status: Never    Passive exposure: Yes   Smokeless tobacco: Never   Tobacco comments:    mom smokes outside  Vaping Use   Vaping status: Never Used  Substance and Sexual Activity   Alcohol use: Yes    Comment: socially   Drug use: No   Sexual activity: Never    Birth control/protection: None, Abstinence  Other Topics Concern   Not on file  Social History Narrative   Lives with 2 cousins, brother, and mom   Social Determinants of Health   Financial Resource Strain: Not on file  Food Insecurity: No Food Insecurity (07/03/2022)   Hunger Vital Sign    Worried About Running Out of Food in the Last Year: Never true    Ran Out of Food in the Last Year: Never true  Transportation Needs: No Transportation Needs (07/03/2022)   PRAPARE - Administrator, Civil Service (Medical): No    Lack of Transportation (Non-Medical): No  Physical Activity: Not on file  Stress: Not on file  Social Connections: Not on file  Intimate Partner Violence: Not on file    Family History  Problem Relation Age  of Onset   Stroke Maternal Grandmother    Hypertension Maternal Grandmother    Hyperlipidemia Maternal Grandmother    Diabetes type II Maternal Grandmother    Hypertension Paternal Grandmother    Heart disease Paternal Grandmother     Past Surgical History:  Procedure Laterality Date   ADENOIDECTOMY     TONSILLECTOMY     WISDOM TOOTH EXTRACTION Bilateral 2020    ROS: Review of Systems Negative except as stated above  PHYSICAL EXAM: BP 113/76   Pulse 80   Temp 98.6 F (37 C) (Oral)   Ht 5\' 5"  (1.651 m)   Wt 255 lb (115.7 kg)   LMP 03/26/2023 (Exact Date)   SpO2  98%   BMI 42.43 kg/m   Physical Exam HENT:     Head: Normocephalic and atraumatic.     Nose: Nose normal.     Mouth/Throat:     Mouth: Mucous membranes are moist.     Pharynx: Oropharynx is clear.  Eyes:     Extraocular Movements: Extraocular movements intact.     Conjunctiva/sclera: Conjunctivae normal.     Pupils: Pupils are equal, round, and reactive to light.  Cardiovascular:     Rate and Rhythm: Normal rate and regular rhythm.     Pulses: Normal pulses.     Heart sounds: Normal heart sounds.  Pulmonary:     Effort: Pulmonary effort is normal.     Breath sounds: Normal breath sounds.  Musculoskeletal:        General: Normal range of motion.     Cervical back: Normal range of motion and neck supple.  Skin:    General: Skin is warm and dry.     Findings: Rash present.  Neurological:     General: No focal deficit present.     Mental Status: She is alert and oriented to person, place, and time.  Psychiatric:        Mood and Affect: Mood normal.        Behavior: Behavior normal.      ASSESSMENT AND PLAN: 1. Encounter for weight management 2. BMI 40.0-44.9, adult (HCC) - Phentermine as prescribed. Counseled on medication adherence and adverse effects.  - I did check the [facility] prescription drug database. - Counseled on low-sodium DASH diet and 150 minutes of moderate intensity exercise per week as tolerated to assist with weight management.  - Follow-up with primary provider in 4 weeks or sooner if needed. - phentermine 15 MG capsule; Take 1 capsule (15 mg total) by mouth every morning.  Dispense: 30 capsule; Refill: 0  3. Rash and nonspecific skin eruption - Triamcinolone ointment as prescribed. Counseled on medication adherence. - Follow-up with primary provider as scheduled.  - triamcinolone ointment (KENALOG) 0.5 %; Apply 1 Application topically 2 (two) times daily.  Dispense: 60 g; Refill: 1    Patient was given the opportunity to ask questions.   Patient verbalized understanding of the plan and was able to repeat key elements of the plan. Patient was given clear instructions to go to Emergency Department or return to medical center if symptoms don't improve, worsen, or new problems develop.The patient verbalized understanding.  Requested Prescriptions   Signed Prescriptions Disp Refills   phentermine 15 MG capsule 30 capsule 0    Sig: Take 1 capsule (15 mg total) by mouth every morning.   triamcinolone ointment (KENALOG) 0.5 % 60 g 1    Sig: Apply 1 Application topically 2 (two) times daily.    Return  in about 4 weeks (around 06/16/2023) for Follow-Up or next available weight check.  Rema Fendt, NP

## 2023-05-19 NOTE — Progress Notes (Signed)
Pt wants a physical as well.   Can you speak to Pt on healthy eating habits for breakfasts, lunches, and dinners. She mentioned not being able to drop her weight under what it was.   Pt wants to know about bumps on arms.

## 2023-06-10 ENCOUNTER — Ambulatory Visit
Admission: EM | Admit: 2023-06-10 | Discharge: 2023-06-10 | Disposition: A | Discharge status: Home / Self Care | Payer: Medicaid Other | Attending: Internal Medicine | Admitting: Internal Medicine

## 2023-06-10 ENCOUNTER — Encounter: Payer: Self-pay | Admitting: Physician Assistant

## 2023-06-10 ENCOUNTER — Ambulatory Visit: Payer: Self-pay

## 2023-06-10 DIAGNOSIS — N3001 Acute cystitis with hematuria: Secondary | ICD-10-CM

## 2023-06-10 LAB — POCT URINALYSIS DIP (MANUAL ENTRY)
Bilirubin, UA: NEGATIVE
Glucose, UA: NEGATIVE mg/dL
Ketones, POC UA: NEGATIVE mg/dL
Nitrite, UA: POSITIVE — AB
Protein Ur, POC: >=300 mg/dL — AB
Spec Grav, UA: 1.025 (ref 1.010–1.025)
Urobilinogen, UA: 1 U/dL
pH, UA: 7 (ref 5.0–8.0)

## 2023-06-10 MED ORDER — SULFAMETHOXAZOLE-TRIMETHOPRIM 800-160 MG PO TABS: 1.0000 | ORAL_TABLET | Freq: Two times a day (BID) | ORAL | 0 refills | Status: AC

## 2023-06-10 NOTE — ED Provider Notes (Signed)
[facility]    CSN: [id number] Arrival date & time: 06/10/23  0859      History   Chief Complaint Chief Complaint  Patient presents with   UTI Symptoms    HPI Nylene A Mcclellan is a 22 y.o. female.   HPI Urinary tract infection symptom onset 2 days ago symptoms include frequency, urgency, decreased urine output.  Admits abdominal discomfort worse if she lays on her abdomen.Marney Doctor.  Denies fever, chills, sweats, diarrhea, vaginal discharge, genital rash, concern for STI.  Admits 1 episode of vomiting yesterday.  Admits cloudy urine, denies urine color or odor of urine.  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   Anxiety and depression 01/07/2022   Obesity due to excess calories without serious comorbidity with body mass index (BMI) in 95th to 98th percentile for age in pediatric patient 06/29/2018   Vitamin D deficiency 06/29/2018   Acanthosis nigricans 06/29/2018    Past Surgical History:  Procedure Laterality Date   ADENOIDECTOMY     TONSILLECTOMY     WISDOM TOOTH EXTRACTION Bilateral 2020    OB History     Gravida  0   Para  0   Term  0   Preterm  0   AB  0   Living  0      SAB  0   IAB  0   Ectopic  0   Multiple  0   Live Births  0            Home Medications    Prior to Admission medications   Medication Sig Start Date End Date Taking? Authorizing Provider  phenazopyridine (PYRIDIUM) 97 MG tablet Take 97 mg by mouth 3 (three) times daily as needed for pain. Dose Sunday evening through Tuesday (06-09-2023)   Yes [provider]  phentermine 15 MG capsule Take 1 capsule (15 mg total) by mouth every morning. 05/19/23  Yes Zonia Kief, Amy J, NP  lidocaine (XYLOCAINE) 2 % solution Use as directed 15 mLs in the mouth or throat every 4 (four) hours as needed for mouth pain. Swish/gargle and  spit Patient not taking: Reported on 07/03/2022 01/16/22   Rhys Martini, PA-C  medroxyPROGESTERone (PROVERA) 10 MG tablet Take 1 tablet (10 mg total) by mouth daily. Patient not taking: Reported on 05/19/2023 07/03/22   Reva Bores, MD  ondansetron (ZOFRAN-ODT) 8 MG disintegrating tablet Take 1 tablet (8 mg total) by mouth every 8 (eight) hours as needed for nausea or vomiting. Patient not taking: Reported on 07/03/2022 01/16/22   Rhys Martini, PA-C  predniSONE (DELTASONE) 20 MG tablet Take 2 tablets (40 mg total) by mouth daily. Patient not taking: Reported on 05/19/2023 12/24/22   Mardella Layman, MD  promethazine-dextromethorphan (PROMETHAZINE-DM) 6.25-15 MG/5ML syrup Take 5 mLs by mouth 4 (four) times daily as needed for cough. Patient not taking: Reported on 05/19/2023 12/24/22   Mardella Layman, MD  triamcinolone ointment (KENALOG) 0.5 % Apply 1 Application topically 2 (two) times daily. 05/19/23   Rema Fendt, NP    Family History Family History  Problem Relation Age of Onset   Stroke Maternal Grandmother    Hypertension Maternal Grandmother    Hyperlipidemia Maternal Grandmother    Diabetes type II Maternal Grandmother    Hypertension Paternal Grandmother    Heart disease Paternal  Grandmother     Social History Social History   Tobacco Use   Smoking status: Never    Passive exposure: Yes   Smokeless tobacco: Never   Tobacco comments:    mom smokes outside  Vaping Use   Vaping status: Never Used  Substance Use Topics   Alcohol use: Yes    Comment: socially   Drug use: No     Allergies   Peanut-containing drug products   Review of Systems Review of Systems  Constitutional:  Negative for appetite change, chills and fatigue.  Gastrointestinal:  Positive for abdominal pain (States feels bloated) and vomiting (1 episode). Negative for diarrhea.  Genitourinary:  Positive for dysuria and frequency. Negative for flank pain, genital sores, hematuria, menstrual problem  (LMP  05/31/2023), vaginal bleeding, vaginal discharge and vaginal pain.  Musculoskeletal:  Negative for back pain.     Physical Exam Triage Vital Signs ED Triage Vitals  Encounter Vitals Group     BP 06/10/23 0908 128/82     Systolic BP Percentile --      Diastolic BP Percentile --      Pulse Rate 06/10/23 0908 (!) 102     Resp 06/10/23 0908 18     Temp 06/10/23 0908 98.8 F (37.1 C)     Temp Source 06/10/23 0908 Oral     SpO2 06/10/23 0908 98 %     Weight 06/10/23 0912 251 lb (113.9 kg)     Height 06/10/23 0912 5\' 4"  (1.626 m)     Head Circumference --      Peak Flow --      Pain Score 06/10/23 0908 0     Pain Loc --      Pain Education --      Exclude from Growth Chart --    No data found.  Updated Vital Signs BP 128/82 (BP Location: Left Arm)   Pulse (!) 102   Temp 98.8 F (37.1 C) (Oral)   Resp 18   Ht 5\' 4"  (1.626 m)   Wt 251 lb (113.9 kg)   LMP 05/31/2023 (Exact Date)   SpO2 98%   BMI 43.08 kg/m   Visual Acuity Right Eye Distance:   Left Eye Distance:   Bilateral Distance:    Right Eye Near:   Left Eye Near:    Bilateral Near:     Physical Exam Vitals and nursing note reviewed.  Constitutional:      Appearance: She is obese. She is not ill-appearing.  HENT:     Head: Normocephalic and atraumatic.  Cardiovascular:     Rate and Rhythm: Normal rate and regular rhythm.     Heart sounds: Normal heart sounds.  Pulmonary:     Effort: Pulmonary effort is normal.     Breath sounds: Normal breath sounds.  Abdominal:     General: Bowel sounds are normal.     Palpations: Abdomen is soft. There is no mass.     Tenderness: There is no abdominal tenderness. There is no right CVA tenderness or left CVA tenderness.  Skin:    General: Skin is warm and dry.  Neurological:     Mental Status: She is alert and oriented to person, place, and time.      UC Treatments / Results  Labs (all labs ordered are listed, but only abnormal results are displayed) Labs  Reviewed  POCT URINALYSIS DIP (MANUAL ENTRY) - Abnormal; Notable for the following components:      Result Value  Clarity, UA cloudy (*)    Blood, UA large (*)    Protein Ur, POC >=300 (*)    Nitrite, UA Positive (*)    Leukocytes, UA Small (1+) (*)    All other components within normal limits    EKG   Radiology No results found.  Procedures Procedures (including critical care time)  Medications Ordered in UC Medications - No data to display  Initial Impression / Assessment and Plan / UC Course  I have reviewed the triage vital signs and the nursing notes.  Pertinent labs & imaging results that were available during my care of the patient were reviewed by me and considered in my medical decision making (see chart for details).     22 year old female with UTI symptoms for 2 days, point-of-care urinalysis positive for nitrates, large blood, cloudy urine and protein. No vaginal discharge or concern for STI. Treat with Bactrim DS x 3 days, warning signs and follow-up reviewed with patient follow-up if symptoms fail to improve or development of new symptoms such as fever back pain, persistent vomiting Final Clinical Impressions(s) / UC Diagnoses   Final diagnoses:  None   Discharge Instructions   None    ED Prescriptions   None    PDMP not reviewed this encounter.   Meliton Rattan, [place] 06/10/23 [phone]

## 2023-06-10 NOTE — Telephone Encounter (Signed)
Summary: Question UTI   Frequent urination with pain, vomiting, abdominal pain, patient stated she googled and they suggested she try  AZO . She did and she said it is not working.      "Call cannot be completed" x 2.

## 2023-06-10 NOTE — Telephone Encounter (Signed)
Third attempt to reach pt. No answer. °

## 2023-06-10 NOTE — Telephone Encounter (Signed)
Second attempt to reach pt. Call cannot be completed.

## 2023-06-10 NOTE — ED Triage Notes (Signed)
"  Symptoms started Sunday with chilling sensation during urination, then abd pain & dysuria at times with a lot of pressure". No fever. "I did AZO for 2 days (Sunday evening to Tuesday evening". Last night I was drinking a lot of water "I threw up 1x". No vaginal discharge or concern for STI.

## 2023-06-18 ENCOUNTER — Encounter: Payer: Medicaid Other | Admitting: Family

## 2023-06-18 NOTE — Progress Notes (Signed)
Erroneous encounter-disregard

## 2023-06-25 ENCOUNTER — Other Ambulatory Visit: Payer: Self-pay | Admitting: Family

## 2023-06-25 DIAGNOSIS — Z6841 Body Mass Index (BMI) 40.0 and over, adult: Secondary | ICD-10-CM

## 2023-06-25 DIAGNOSIS — Z7689 Persons encountering health services in other specified circumstances: Secondary | ICD-10-CM

## 2023-06-25 NOTE — Telephone Encounter (Signed)
Schedule appointment?

## 2023-06-26 NOTE — Telephone Encounter (Signed)
Schedule appointment?

## 2023-07-19 ENCOUNTER — Encounter: Payer: Self-pay | Admitting: Family

## 2023-07-20 NOTE — Telephone Encounter (Signed)
Amy , please address if patient need appt please

## 2023-07-20 NOTE — Telephone Encounter (Signed)
I have attempted to contact this patient by phone with the following results: no answer.

## 2023-07-20 NOTE — Telephone Encounter (Signed)
Schedule appointment. For immediate medical evaluation report to the Emergency Department/Urgent Care/call 911 and follow-up with Primary Care.

## 2023-08-12 ENCOUNTER — Ambulatory Visit (INDEPENDENT_AMBULATORY_CARE_PROVIDER_SITE_OTHER): Payer: Medicaid Other | Admitting: Family

## 2023-08-12 ENCOUNTER — Encounter: Payer: Self-pay | Admitting: Family

## 2023-08-12 ENCOUNTER — Other Ambulatory Visit (HOSPITAL_COMMUNITY)
Admission: RE | Admit: 2023-08-12 | Discharge: 2023-08-12 | Disposition: A | Discharge status: Home / Self Care | Payer: Medicaid Other | Source: Ambulatory Visit | Attending: Family | Admitting: Family

## 2023-08-12 VITALS — BP 117/77 | HR 86 | Temp 98.2°F | Ht 64.5 in | Wt 262.6 lb

## 2023-08-12 DIAGNOSIS — R3989 Other symptoms and signs involving the genitourinary system: Secondary | ICD-10-CM | POA: Diagnosis not present

## 2023-08-12 DIAGNOSIS — Z113 Encounter for screening for infections with a predominantly sexual mode of transmission: Secondary | ICD-10-CM | POA: Insufficient documentation

## 2023-08-12 DIAGNOSIS — Z124 Encounter for screening for malignant neoplasm of cervix: Secondary | ICD-10-CM | POA: Diagnosis present

## 2023-08-12 DIAGNOSIS — Z114 Encounter for screening for human immunodeficiency virus [HIV]: Secondary | ICD-10-CM | POA: Diagnosis not present

## 2023-08-12 DIAGNOSIS — R350 Frequency of micturition: Secondary | ICD-10-CM

## 2023-08-12 DIAGNOSIS — K5900 Constipation, unspecified: Secondary | ICD-10-CM

## 2023-08-12 LAB — POCT URINALYSIS DIP (CLINITEK)
Bilirubin, UA: NEGATIVE
Blood, UA: NEGATIVE
Glucose, UA: NEGATIVE mg/dL
Ketones, POC UA: NEGATIVE mg/dL
Leukocytes, UA: NEGATIVE
Nitrite, UA: NEGATIVE
POC PROTEIN,UA: NEGATIVE
Spec Grav, UA: >=1.03 — AB (ref 1.010–1.025)
Urobilinogen, UA: 0.2 U/dL
pH, UA: 5.5 (ref 5.0–8.0)

## 2023-08-12 MED ORDER — SENNOSIDES 8.6 MG PO TABS
1.0000 | ORAL_TABLET | Freq: Every day | ORAL | 0 refills | Status: DC
Start: 2023-08-12 — End: 2024-01-16

## 2023-08-12 NOTE — Progress Notes (Signed)
Patient ID: OTTO EADER, female    DOB: [birth year]  MRN: [id number]  CC: Frequent Urination/Pap Smear   Subjective: Rhonda Sims is a 22 y.o. female who presents for frequent urination/pap smear.   Her concerns today include:  - Reports frequent urination. States "bladder pressure". Reports after she urinates she still feels that she has urine in bladder. She denies red flag symptoms.  - Constipation. States bowel movements look like "pebbles". Denies red flag symptoms.  - Pap smear.   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 Swish/gargle and spit (Patient not taking: Reported on 07/03/2022) 100 mL 0   medroxyPROGESTERone (PROVERA) 10 MG tablet Take 1 tablet (10 mg total) by mouth daily. (Patient not taking: Reported on 05/19/2023) 30 tablet 3   ondansetron (ZOFRAN-ODT) 8 MG disintegrating tablet Take 1 tablet (8 mg total) by mouth every 8 (eight) hours as needed for nausea or vomiting. (Patient not taking: Reported on 07/03/2022) 20 tablet 0   phenazopyridine (PYRIDIUM) 97 MG tablet Take 97 mg by mouth 3 (three) times daily as needed for pain. Dose Sunday evening through Tuesday (06-09-2023) (Patient not taking: Reported on 08/12/2023)     phentermine 15 MG capsule Take 1 capsule (15 mg total) by mouth every morning. 30 capsule 0   predniSONE (DELTASONE) 20 MG tablet Take 2 tablets (40 mg total) by mouth daily. (Patient not taking: Reported on 05/19/2023)  10 tablet 0   promethazine-dextromethorphan (PROMETHAZINE-DM) 6.25-15 MG/5ML syrup Take 5 mLs by mouth 4 (four) times daily as needed for cough. (Patient not taking: Reported on 05/19/2023) 118 mL 0   triamcinolone ointment (KENALOG) 0.5 % Apply 1 Application topically 2 (two) times daily. (Patient not taking: Reported on 08/12/2023) 60 g 1   No current facility-administered medications on file prior to visit.    Allergies  Allergen Reactions   Peanut-Containing Drug Products Itching    Social History   Socioeconomic History   Marital status: Single    Spouse name: Not on file   Number of children: Not on file   Years of education: Not on file   Highest education level: Not on file  Occupational History   Not on file  Tobacco Use   Smoking status: Never    Passive exposure: Yes   Smokeless tobacco: Never   Tobacco comments:    mom smokes outside  Vaping Use   Vaping status: Never Used  Substance and Sexual Activity   Alcohol use: Yes    Comment: socially   Drug use: No   Sexual activity: Yes    Birth control/protection: None    Comment: Last encounter: 06-06-2023 (Same sex)  Other Topics Concern   Not on file  Social History Narrative   Lives with 2 cousins, brother, and mom   Social Determinants of Health   Financial Resource Strain: Low Risk  (08/12/2023)   Overall Financial Resource Strain (CARDIA)    Difficulty of Paying Living Expenses:  Not hard at all  Food Insecurity: No Food Insecurity (07/03/2022)   Hunger Vital Sign    Worried About Running Out of Food in the Last Year: Never true    Ran Out of Food in the Last Year: Never true  Transportation Needs: No Transportation Needs (07/03/2022)   PRAPARE - Administrator, Civil Service (Medical): No    Lack of Transportation (Non-Medical): No  Physical Activity: Not on file  Stress: No Stress Concern Present (08/12/2023)   [employer] of Occupational Health - Occupational Stress Questionnaire     Feeling of Stress : Not at all  Social Connections: Not on file  Intimate Partner Violence: Not on file    Family History  Problem Relation Age of Onset   Stroke Maternal Grandmother    Hypertension Maternal Grandmother    Hyperlipidemia Maternal Grandmother    Diabetes type II Maternal Grandmother    Hypertension Paternal Grandmother    Heart disease Paternal Grandmother     Past Surgical History:  Procedure Laterality Date   ADENOIDECTOMY     TONSILLECTOMY     WISDOM TOOTH EXTRACTION Bilateral 2020    ROS: Review of Systems Negative except as stated above  PHYSICAL EXAM: BP 117/77   Pulse 86   Temp 98.2 F (36.8 C) (Oral)   Ht 5' 4.5" (1.638 m)   Wt 262 lb 9.6 oz (119.1 kg)   LMP 05/31/2023 (Approximate)   SpO2 98%   BMI 44.38 kg/m   Physical Exam HENT:     Head: Normocephalic and atraumatic.     Nose: Nose normal.     Mouth/Throat:     Mouth: Mucous membranes are moist.     Pharynx: Oropharynx is clear.  Eyes:     Extraocular Movements: Extraocular movements intact.     Conjunctiva/sclera: Conjunctivae normal.     Pupils: Pupils are equal, round, and reactive to light.  Cardiovascular:     Rate and Rhythm: Normal rate and regular rhythm.     Pulses: Normal pulses.     Heart sounds: Normal heart sounds.  Pulmonary:     Effort: Pulmonary effort is normal.     Breath sounds: Normal breath sounds.  Genitourinary:    General: Normal vulva.     Vagina: Normal.     Cervix: Normal.     Uterus: Normal.      Adnexa: Right adnexa normal and left adnexa normal.     Comments: Curley Spice, CMA present.  Musculoskeletal:        General: Normal range of motion.     Cervical back: Normal range of motion and neck supple.  Neurological:     General: No focal deficit present.     Mental Status: She is alert and oriented to person, place, and time.  Psychiatric:        Mood and Affect: Mood normal.        Behavior: Behavior normal.     ASSESSMENT AND  PLAN: 1. Pap smear for cervical cancer screening - Routine screening.  - Cytology - PAP()  2. Routine screening for STI (sexually transmitted infection) - Routine screening.  - Cervicovaginal ancillary only  3. Encounter for screening for HIV - Routine screening.  - HIV antibody (with reflex)  4. Frequent urination 5. Sensation of pressure in bladder area - Routine screening.  - Referral to Urology for evaluation/management. During the interim follow-up with primary provider as scheduled until established with referral.  - POCT URINALYSIS  DIP (CLINITEK); Future - Ambulatory referral to Urology - Urine Culture  6. Constipation, unspecified constipation type - Senna as prescribed. Counseled on medication adherence/adverse effects.  - Follow-up with primary provider as scheduled.  - senna (SENOKOT) 8.6 MG tablet; Take 1 tablet (8.6 mg total) by mouth daily.  Dispense: 90 tablet; Refill: 0    Patient was given the opportunity to ask questions.  Patient verbalized understanding of the plan and was able to repeat key elements of the plan. Patient was given clear instructions to go to Emergency Department or return to medical center if symptoms don't improve, worsen, or new problems develop.The patient verbalized understanding.   Orders Placed This Encounter  Procedures   Urine Culture   HIV antibody (with reflex)   Ambulatory referral to Urology   POCT URINALYSIS DIP (CLINITEK)     Requested Prescriptions   Signed Prescriptions Disp Refills   senna (SENOKOT) 8.6 MG tablet 90 tablet 0    Sig: Take 1 tablet (8.6 mg total) by mouth daily.    Follow-up with primary provider as scheduled.   Rema Fendt, NP

## 2023-08-12 NOTE — Progress Notes (Signed)
Patient states her pee doesn't seem like a UTI, she says she thinks its her bladder.   Patient wants a Pap done.

## 2023-08-13 ENCOUNTER — Ambulatory Visit: Payer: Medicaid Other | Admitting: Family Medicine

## 2023-08-13 ENCOUNTER — Telehealth: Payer: Self-pay

## 2023-08-13 LAB — HIV ANTIBODY (ROUTINE TESTING W REFLEX): HIV Screen 4th Generation wRfx: NONREACTIVE

## 2023-08-13 NOTE — Telephone Encounter (Signed)
-----   Message from Nurse Eustace Pen B sent at 08/13/2023 10:27 AM EST -----  ----- Message ----- From: Rema Fendt, NP Sent: 08/13/2023   7:59 AM EST To: Claudie Leach, CMA  HIV non reactive.

## 2023-08-13 NOTE — Telephone Encounter (Signed)
Called patient twice, stated that " call cannot be completed as dialed"

## 2023-08-14 ENCOUNTER — Other Ambulatory Visit: Payer: Self-pay | Admitting: Family

## 2023-08-14 DIAGNOSIS — B3731 Acute candidiasis of vulva and vagina: Secondary | ICD-10-CM

## 2023-08-14 DIAGNOSIS — N760 Acute vaginitis: Secondary | ICD-10-CM

## 2023-08-14 LAB — CERVICOVAGINAL ANCILLARY ONLY
Bacterial Vaginitis (gardnerella): POSITIVE — AB
Candida Glabrata: NEGATIVE
Candida Vaginitis: POSITIVE — AB
Chlamydia: NEGATIVE
Comment: NEGATIVE
Comment: NEGATIVE
Comment: NEGATIVE
Comment: NEGATIVE
Comment: NEGATIVE
Comment: NORMAL
Neisseria Gonorrhea: NEGATIVE
Trichomonas: NEGATIVE

## 2023-08-14 LAB — URINE CULTURE: Organism ID, Bacteria: NO GROWTH

## 2023-08-14 LAB — CYTOLOGY - PAP
Adequacy: ABSENT
Diagnosis: NEGATIVE

## 2023-08-14 MED ORDER — METRONIDAZOLE 500 MG PO TABS
500.0000 mg | ORAL_TABLET | Freq: Two times a day (BID) | ORAL | 0 refills | Status: AC
Start: 2023-08-14 — End: 2023-08-21

## 2023-08-14 MED ORDER — FLUCONAZOLE 150 MG PO TABS
150.0000 mg | ORAL_TABLET | ORAL | 0 refills | Status: AC
Start: 2023-08-14 — End: 2023-08-21

## 2023-08-18 ENCOUNTER — Ambulatory Visit: Payer: Medicaid Other | Admitting: Obstetrics and Gynecology

## 2023-09-22 NOTE — Progress Notes (Signed)
 GYNECOLOGY VISIT  Patient name: Rhonda Sims MRN [id number]  Date of birth: 2001-01-08 Chief Complaint:   Gynecologic Exam  History:  Rhonda Sims is a 23 y.o. G0P0000 being seen today for irregular menses.  Last seen in 2023. Menses in September and then December - regular flow but very painful. Previously taking 1 provera  pill monthly. Prevoiusly declined blood work for presumed PCOS. On birth control pilsl prior to COVID.     Discussed the use of AI scribe software for clinical note transcription with the patient, who gave verbal consent to proceed.  History of Present Illness   The patient, diagnosed with Polycystic Ovary Syndrome (PCOS), presents with concerns about irregular menstrual cycles. She was previously prescribed Provera , initially misunderstanding the instructions and taking one pill per month. After realizing the error, she began taking the medication daily, which resulted in regular monthly periods. However, she experienced a lapse in her menstrual cycle in October and November, with the next period occurring in December. This period was notably painful, which she attributes to the missed cycles in the preceding months.  The patient expresses a strong desire to maintain regular menstrual cycles, associating the absence of a cycle with heightened emotional distress. She also expresses fear of reverting to a previous pattern of prolonged bleeding, which she experienced before starting Provera . She has decided to resume taking Provera  daily to ensure regular cycles. They are currently sexually active with a female partner and no desire for conception at all.   The patient also mentions a habit of consuming corn starch, raising concerns about potential anemia. She expresses significant anxiety about needles and blood draws, which are necessary for monitoring her condition. She requests to have these procedures performed at a different location where she has had a  more positive experience in the past.   Patient noted at close of visit that they also experience constipation. Have tried laxative chocolates and miralax without much improvement and wondering who she can talk to about it.       Past Medical History:  Diagnosis Date   Asthma    Seasonal allergies     Past Surgical History:  Procedure Laterality Date   ADENOIDECTOMY     TONSILLECTOMY     WISDOM TOOTH EXTRACTION Bilateral 2020    The following portions of the patient's history were reviewed and updated as appropriate: allergies, current medications, past family history, past medical history, past social history, past surgical history and problem list.   Health Maintenance:   Last pap     Component Value Date/Time   DIAGPAP  08/12/2023 1043    - Negative for intraepithelial lesion or malignancy (NILM)   ADEQPAP  08/12/2023 1043    Satisfactory for evaluation; transformation zone component ABSENT.    High Risk HPV: Positive  Adequacy:  Satisfactory for evaluation, transformation zone component PRESENT  Diagnosis:  Atypical squamous cells of undetermined significance (ASC-US )  Last mammogram: n/a   Review of Systems:  Pertinent items are noted in HPI. Comprehensive review of systems was otherwise negative.   Objective:  Physical Exam BP 137/70   Pulse 82   Wt 265 lb 11.2 oz (120.5 kg)   LMP 08/17/2023 (Approximate)   BMI 44.90 kg/m    Physical Exam Vitals and nursing note reviewed.  Constitutional:      Appearance: Normal appearance.  HENT:     Head: Normocephalic and atraumatic.  Pulmonary:     Effort: Pulmonary effort is normal.  Neurological:  General: No focal deficit present.     Mental Status: She is alert.  Psychiatric:        Mood and Affect: Mood normal.        Behavior: Behavior normal.        Thought Content: Thought content normal.        Judgment: Judgment normal.       Assessment & Plan:   Assessment and Plan    Irregular  Menstrual Cycles Patient has been taking Provera  inconsistently followed by cessation resulting in irregular periods. Discussed the importance of regular cycles to prevent abnormal uterine lining buildup and potential precancerous changes. Patient prefers to have regular periods and does not wish to become pregnant. -Resume daily Provera , as previously tolerated. -Send new prescription to [facility] on [address].  General Health Maintenance Patient has a history of consuming corn starch, which may indicate iron deficiency anemia. Also, it has been a while since the last A1c was checked, which is important given the potential association between PCOS and diabetes. -Order complete blood count to assess for anemia. -Order A1c to screen for diabetes. -Patient to schedule lab visit at a convenient time.   Constipation Patient notes longstanding constipation without improvement with over the counter laxatives -Recommend adequate water and fiber intake -Trial of magnesium citrate to have a bowel movement -Follow up with PCP       Routine preventative health maintenance measures emphasized.  Kiki Pelton, MD [facility], [facility]

## 2023-09-23 ENCOUNTER — Other Ambulatory Visit: Payer: Self-pay

## 2023-09-23 ENCOUNTER — Encounter: Payer: Self-pay | Admitting: Obstetrics and Gynecology

## 2023-09-23 ENCOUNTER — Ambulatory Visit: Payer: Medicaid Other | Admitting: Obstetrics and Gynecology

## 2023-09-23 VITALS — BP 137/70 | HR 82 | Wt 265.7 lb

## 2023-09-23 DIAGNOSIS — Z1331 Encounter for screening for depression: Secondary | ICD-10-CM

## 2023-09-23 DIAGNOSIS — K5904 Chronic idiopathic constipation: Secondary | ICD-10-CM | POA: Diagnosis not present

## 2023-09-23 DIAGNOSIS — N914 Secondary oligomenorrhea: Secondary | ICD-10-CM

## 2023-09-23 MED ORDER — MEDROXYPROGESTERONE ACETATE 10 MG PO TABS
10.0000 mg | ORAL_TABLET | Freq: Every day | ORAL | 3 refills | Status: DC
Start: 2023-09-23 — End: 2024-01-16

## 2023-09-23 NOTE — Patient Instructions (Signed)
 Can try magnesium citrate

## 2023-09-30 ENCOUNTER — Other Ambulatory Visit: Payer: Medicaid Other

## 2023-11-25 ENCOUNTER — Ambulatory Visit
Admission: RE | Admit: 2023-11-25 | Discharge: 2023-11-25 | Discharge status: Home / Self Care | Payer: Self-pay | Source: Ambulatory Visit

## 2023-11-25 ENCOUNTER — Encounter: Payer: Self-pay | Admitting: *Deleted

## 2023-11-25 ENCOUNTER — Ambulatory Visit
Admission: RE | Admit: 2023-11-25 | Discharge: 2023-11-25 | Disposition: A | Discharge status: Home / Self Care | Source: Ambulatory Visit | Attending: Nurse Practitioner | Admitting: Nurse Practitioner

## 2023-11-25 ENCOUNTER — Other Ambulatory Visit: Payer: Self-pay

## 2023-11-25 DIAGNOSIS — F419 Anxiety disorder, unspecified: Secondary | ICD-10-CM

## 2023-11-25 DIAGNOSIS — R519 Headache, unspecified: Secondary | ICD-10-CM

## 2023-11-25 DIAGNOSIS — N898 Other specified noninflammatory disorders of vagina: Secondary | ICD-10-CM | POA: Diagnosis not present

## 2023-11-25 DIAGNOSIS — R42 Dizziness and giddiness: Secondary | ICD-10-CM | POA: Diagnosis not present

## 2023-11-25 DIAGNOSIS — G4700 Insomnia, unspecified: Secondary | ICD-10-CM | POA: Diagnosis present

## 2023-11-25 LAB — POCT URINALYSIS DIP (MANUAL ENTRY)
Bilirubin, UA: NEGATIVE
Blood, UA: NEGATIVE
Glucose, UA: NEGATIVE mg/dL
Ketones, POC UA: NEGATIVE mg/dL
Nitrite, UA: NEGATIVE
Protein Ur, POC: 30 mg/dL — AB
Spec Grav, UA: >=1.03 — AB
Urobilinogen, UA: 1 U/dL
pH, UA: 7

## 2023-11-25 MED ORDER — HYDROXYZINE HCL 25 MG PO TABS: 25.0000 mg | ORAL_TABLET | Freq: Every day | ORAL | 0 refills | Status: DC

## 2023-11-25 MED ORDER — IBUPROFEN 600 MG PO TABS: 600.0000 mg | ORAL_TABLET | Freq: Four times a day (QID) | ORAL | 0 refills | Status: DC | PRN

## 2023-11-25 MED ORDER — FLUCONAZOLE 150 MG PO TABS: 150.0000 mg | ORAL_TABLET | ORAL | 0 refills | Status: AC

## 2023-11-25 NOTE — ED Triage Notes (Signed)
 Pt reports intermittent headache (points to back of her head) since Sunday. Denies fever. Pt also reports she has a painful, itchy rash in her private area since Friday. States "the rash hurts when I wipe". 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

## 2023-11-25 NOTE — Discharge Instructions (Addendum)
 You were seen today for headaches, dizziness, cough, anxiety, insomnia, vaginal itching, and discharge. Your urine test showed no infection. We are sending a vaginal swab to check for yeast, bacteria, or an STI. You'll be notified only if the results are positive and require treatment. You can review all test results in MyChart.  Please follow these instructions: - Take medications as prescribed. - Drink plenty of fluids. - Limit or avoid alcohol, caffeine, and tobacco, especially before bedtime. - Avoid heavy meals before bed. - Make your bedroom dark and comfortable to promote sleep, and limit screen time before bed.  If your symptoms persist or don't improve, follow up with your primary care provider.

## 2023-11-25 NOTE — ED Provider Notes (Signed)
 [facility]    CSN: [id number] Arrival date & time: 11/25/23  1533      History   Chief Complaint Chief Complaint  Patient presents with   Headache   Rash    HPI Rhonda Sims is a 23 y.o. female.   Rhonda Sims is a 23 year old female presenting for evaluation of intermittent headache and dizziness, which have been ongoing for the past four days. The dizziness typically occurs with quick position changes, lasting only a few seconds before resolving. She denies any loss of consciousness, blurred vision, or floaters but reports some light sensitivity. There is no nausea or vomiting associated with the dizziness. Additionally, the patient mentions experiencing a mild cough, though she denies fevers.  The patient has been struggling with sleep issues for the past three months, describing difficulty staying asleep after initially falling asleep. To manage her symptoms, she has been taking over-the-counter pain medications, specifically PM formulas, in an attempt to alleviate the sleep disturbance. She attributes her headaches to the lack of quality sleep.  The patient has been taking care of her two younger sisters, aged 45 months and 3 years, since December 2024, when her sister was incarcerated. She also works as a Social worker. The patient expresses a significant amount of anxiety, stating that she is always worried about something, but denies any suicidal or homicidal ideation, as well as any hallucinations.  Additionally, she reports vaginal itching for the past five days, accompanied by thick, white vaginal discharge with a strong "chlorine-like" odor. She denies dysuria and has not used any new soaps or detergents. The patient also denies recent sexual activity, with her last encounter occurring about a month ago with a female partner. Based on her symptoms, she suspects she may have a yeast infection.  The following portions of the patient's history were reviewed  and updated as appropriate: allergies, current medications, past family history, past medical history, past social history, past surgical history, and problem list.       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   Anxiety and depression 01/07/2022   Obesity due to excess calories without serious comorbidity with body mass index (BMI) in 95th to 98th percentile for age in pediatric patient 06/29/2018   Vitamin D deficiency 06/29/2018   Acanthosis nigricans 06/29/2018    Past Surgical History:  Procedure Laterality Date   ADENOIDECTOMY     TONSILLECTOMY     WISDOM TOOTH EXTRACTION Bilateral 2020    OB History     Gravida  0   Para  0   Term  0   Preterm  0   AB  0   Living  0      SAB  0   IAB  0   Ectopic  0   Multiple  0   Live Births  0            Home Medications    Prior to Admission medications   Medication Sig Start Date End Date Taking? Authorizing Provider  fluconazole (DIFLUCAN) 150 MG tablet Take 1 tablet (150 mg total) by mouth every 3 (three) days for 2 doses. 11/25/23 11/29/23 Yes Lurline Idol, FNP  hydrOXYzine (ATARAX) 25 MG tablet Take 1 tablet (25 mg total) by mouth at bedtime. For the treatment of insomnia associated with anxiety 11/25/23  Yes Priest Lockridge,  Lelon Mast, FNP  ibuprofen (ADVIL) 600 MG tablet Take 1 tablet (600 mg total) by mouth every 6 (six) hours as needed for headache. 11/25/23  Yes Lurline Idol, FNP  medroxyPROGESTERone (PROVERA) 10 MG tablet Take 1 tablet (10 mg total) by mouth daily. 09/23/23  Yes Lorriane Shire, MD  lidocaine (XYLOCAINE) 2 % solution Use as directed 15 mLs in the mouth or throat every 4 (four) hours as needed for mouth pain. Swish/gargle and spit Patient not taking: Reported on 07/03/2022 01/16/22   Rhys Martini, PA-C  ondansetron (ZOFRAN-ODT) 8 MG disintegrating tablet  Take 1 tablet (8 mg total) by mouth every 8 (eight) hours as needed for nausea or vomiting. Patient not taking: Reported on 07/03/2022 01/16/22   Rhys Martini, PA-C  phenazopyridine (PYRIDIUM) 97 MG tablet Take 97 mg by mouth 3 (three) times daily as needed for pain. Dose Sunday evening through Tuesday (06-09-2023) Patient not taking: Reported on 08/12/2023    [provider]  phentermine 15 MG capsule Take 1 capsule (15 mg total) by mouth every morning. Patient not taking: Reported on 11/25/2023 05/19/23   Rema Fendt, NP  predniSONE (DELTASONE) 20 MG tablet Take 2 tablets (40 mg total) by mouth daily. Patient not taking: Reported on 05/19/2023 12/24/22   Mardella Layman, MD  promethazine-dextromethorphan (PROMETHAZINE-DM) 6.25-15 MG/5ML syrup Take 5 mLs by mouth 4 (four) times daily as needed for cough. Patient not taking: Reported on 05/19/2023 12/24/22   Mardella Layman, MD  senna (SENOKOT) 8.6 MG tablet Take 1 tablet (8.6 mg total) by mouth daily. Patient not taking: Reported on 11/25/2023 08/12/23   Rema Fendt, NP  triamcinolone ointment (KENALOG) 0.5 % Apply 1 Application topically 2 (two) times daily. Patient not taking: Reported on 08/12/2023 05/19/23   Rema Fendt, NP    Family History Family History  Problem Relation Age of Onset   Stroke Maternal Grandmother    Hypertension Maternal Grandmother    Hyperlipidemia Maternal Grandmother    Diabetes type II Maternal Grandmother    Hypertension Paternal Grandmother    Heart disease Paternal Grandmother     Social History Social History   Tobacco Use   Smoking status: Never    Passive exposure: Yes   Smokeless tobacco: Never   Tobacco comments:    mom smokes outside  Vaping Use   Vaping status: Never Used  Substance Use Topics   Alcohol use: Yes    Comment: socially   Drug use: No     Allergies   Peanut-containing drug products   Review of Systems Review of Systems  Constitutional:  Positive for  fatigue. Negative for activity change, appetite change and fever.  HENT:  Positive for postnasal drip. Negative for congestion, rhinorrhea and sore throat.   Eyes:  Positive for photophobia. Negative for visual disturbance.  Respiratory:  Positive for cough.   Gastrointestinal:  Negative for abdominal pain, diarrhea, nausea and vomiting.  Genitourinary:  Positive for vaginal discharge. Negative for dysuria.  Neurological:  Positive for dizziness.  Psychiatric/Behavioral:  Positive for sleep disturbance. Negative for hallucinations and suicidal ideas. The patient is nervous/anxious.   All other systems reviewed and are negative.    Physical Exam Triage Vital Signs ED Triage Vitals  Encounter Vitals Group     BP 11/25/23 1650 (!) 110/57     Systolic BP Percentile --      Diastolic BP Percentile --      Pulse Rate 11/25/23 1650 87     Resp  11/25/23 1650 18     Temp 11/25/23 1650 98.7 F (37.1 C)     Temp Source 11/25/23 1650 Oral     SpO2 11/25/23 1650 98 %     Weight --      Height --      Head Circumference --      Peak Flow --      Pain Score 11/25/23 1647 6     Pain Loc --      Pain Education --      Exclude from Growth Chart --    No data found.  Updated Vital Signs BP 108/78 (BP Location: Left Arm)   Pulse 87   Temp 98.7 F (37.1 C) (Oral)   Resp 18   LMP 11/14/2023   SpO2 98%   Visual Acuity Right Eye Distance:   Left Eye Distance:   Bilateral Distance:    Right Eye Near:   Left Eye Near:    Bilateral Near:     Physical Exam Vitals and nursing note reviewed.  Constitutional:      General: She is not in acute distress.    Appearance: She is well-developed. She is not ill-appearing, toxic-appearing or diaphoretic.  HENT:     Head: Normocephalic.     Mouth/Throat:     Mouth: Mucous membranes are moist.  Eyes:     Extraocular Movements: Extraocular movements intact.     Right eye: No nystagmus.     Left eye: No nystagmus.  Cardiovascular:     Rate  and Rhythm: Normal rate and regular rhythm.     Heart sounds: Normal heart sounds.  Pulmonary:     Effort: Pulmonary effort is normal.     Breath sounds: Normal breath sounds.  Abdominal:     Palpations: Abdomen is soft.  Genitourinary:    Comments: Deferred; patient performed self swab for testing  Musculoskeletal:        General: Normal range of motion.     Cervical back: Normal range of motion and neck supple. No rigidity.  Skin:    General: Skin is warm and dry.  Neurological:     General: No focal deficit present.     Mental Status: She is alert.     Cranial Nerves: Cranial nerves 2-12 are intact.     Sensory: Sensation is intact.     Motor: Motor function is intact.     Coordination: Coordination is intact.     Gait: Gait is intact.  Psychiatric:        Attention and Perception: Attention and perception normal.        Mood and Affect: Mood and affect normal.        Speech: Speech normal.        Behavior: Behavior normal. Behavior is cooperative.        Thought Content: Thought content normal.        Cognition and Memory: Cognition and memory normal.        Judgment: Judgment normal.      UC Treatments / Results  Labs (all labs ordered are listed, but only abnormal results are displayed) Labs Reviewed  POCT URINALYSIS DIP (MANUAL ENTRY) - Abnormal; Notable for the following components:      Result Value   Clarity, UA cloudy (*)    Spec Grav, UA >=1.030 (*)    Protein Ur, POC =30 (*)    Leukocytes, UA Trace (*)    All other components within normal limits  CERVICOVAGINAL  ANCILLARY ONLY    EKG   Radiology No results found.  Procedures Procedures (including critical care time)  Medications Ordered in UC Medications - No data to display  Initial Impression / Assessment and Plan / UC Course  I have reviewed the triage vital signs and the nursing notes.  Pertinent labs & imaging results that were available during my care of the patient were reviewed by me  and considered in my medical decision making (see chart for details).    23 year old female presents with a variety of symptoms including headache, dizziness, light sensitivity, cough, sleep disturbances, anxiety, vaginal itching, and vaginal discharge. The physical exam was unremarkable with no focal deficits, and there were no abnormalities noted in mood or affect. Urinalysis was negative for any acute infection. Hydralazine was prescribed at bedtime to assist with sleep and anxiety. The patient was advised to discontinue the evening formulations of over-the-counter medications. Ibuprofen was recommended for headache relief as needed. Diflucan was ordered to address potential yeast infection. Further diagnostic studies are pending to evaluate for gonorrhea, chlamydia, bacterial vaginosis, or yeast infection as potential causes of her vaginal symptoms. Healthy sleep habits were discussed with the patient, and she was instructed to follow up with her primary care provider if there is no improvement in her symptoms.  Final Clinical Impressions(s) / UC Diagnoses   Final diagnoses:  Acute intractable headache, unspecified headache type  Dizziness, nonspecific  Insomnia, unspecified type  Anxiety  Vaginal itching  Vaginal discharge     Discharge Instructions      You were seen today for headaches, dizziness, cough, anxiety, insomnia, vaginal itching, and discharge. Your urine test showed no infection. We are sending a vaginal swab to check for yeast, bacteria, or an STI. You'll be notified only if the results are positive and require treatment. You can review all test results in MyChart.  Please follow these instructions: - Take medications as prescribed. - Drink plenty of fluids. - Limit or avoid alcohol, caffeine, and tobacco, especially before bedtime. - Avoid heavy meals before bed. - Make your bedroom dark and comfortable to promote sleep, and limit screen time before bed.  If your  symptoms persist or don't improve, follow up with your primary care provider.     ED Prescriptions     Medication Sig Dispense Auth. Provider   hydrOXYzine (ATARAX) 25 MG tablet Take 1 tablet (25 mg total) by mouth at bedtime. For the treatment of insomnia associated with anxiety 10 tablet Lurline Idol, FNP   ibuprofen (ADVIL) 600 MG tablet Take 1 tablet (600 mg total) by mouth every 6 (six) hours as needed for headache. 30 tablet Lurline Idol, FNP   fluconazole (DIFLUCAN) 150 MG tablet Take 1 tablet (150 mg total) by mouth every 3 (three) days for 2 doses. 2 tablet Lurline Idol, FNP      PDMP not reviewed this encounter.   Lurline Idol, [place] 11/30/23 1944

## 2023-11-26 LAB — CERVICOVAGINAL ANCILLARY ONLY
Bacterial Vaginitis (gardnerella): NEGATIVE
Candida Glabrata: NEGATIVE
Candida Vaginitis: POSITIVE — AB
Chlamydia: NEGATIVE
Comment: NEGATIVE
Comment: NEGATIVE
Comment: NEGATIVE
Comment: NEGATIVE
Comment: NEGATIVE
Comment: NORMAL
Neisseria Gonorrhea: NEGATIVE
Trichomonas: NEGATIVE

## 2023-12-16 ENCOUNTER — Encounter: Admitting: Family

## 2023-12-16 NOTE — Progress Notes (Signed)
 Erroneous encounter-disregard

## 2024-01-16 ENCOUNTER — Ambulatory Visit (HOSPITAL_COMMUNITY)
Admission: EM | Admit: 2024-01-16 | Discharge: 2024-01-16 | Disposition: A | Discharge status: Home / Self Care | Attending: Nurse Practitioner | Admitting: Nurse Practitioner

## 2024-01-16 ENCOUNTER — Encounter (HOSPITAL_COMMUNITY): Payer: Self-pay

## 2024-01-16 ENCOUNTER — Other Ambulatory Visit: Payer: Self-pay

## 2024-01-16 DIAGNOSIS — S39012A Strain of muscle, fascia and tendon of lower back, initial encounter: Secondary | ICD-10-CM | POA: Diagnosis not present

## 2024-01-16 MED ORDER — CYCLOBENZAPRINE HCL 10 MG PO TABS: 10.0000 mg | ORAL_TABLET | Freq: Every day | ORAL | 0 refills | Status: AC

## 2024-01-16 MED ORDER — NAPROXEN 500 MG PO TABS: 500.0000 mg | ORAL_TABLET | Freq: Two times a day (BID) | ORAL | 0 refills | Status: AC

## 2024-01-16 MED ORDER — METHOCARBAMOL 500 MG PO TABS: 500.0000 mg | ORAL_TABLET | Freq: Every morning | ORAL | 0 refills | Status: AC

## 2024-01-16 NOTE — ED Provider Notes (Signed)
 [facility]    CSN: [id number] Arrival date & time: 01/16/24  1204      History   Chief Complaint Chief Complaint  Patient presents with   Back Pain    HPI Rhonda Sims is a 23 y.o. female.   Subjective:  Rhonda Sims is a 23 y.o. female that presents with acute right lower back pain that started yesterday  while lifting a car seat with a baby inside. The pain began instantly upon lifting the car seat and is localized to the right lower back without radiation. The patient reports feeling pressure in the back and experiences pain with deep breathing, sneezing, or coughing. Sitting requires leaning to the side for comfort.  The patient attempted self-treatment with over-the-counter pain relievers from [employer] yesterday, taking a total of 5 pills, but found no relief. Another unspecified pain medicine was also tried without success. The patient notes discomfort with touching the affected area. There are no associated symptoms such as numbness, tingling, or weakness in the legs. The patient denies any urinary symptoms, including blood in urine or pain/burning with urination. There is no reported nausea or vomiting.  The following portions of the patient's history were reviewed and updated as appropriate: allergies, current medications, past family history, past medical history, past social history, past surgical history, and problem list.      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   Anxiety and depression 01/07/2022   Obesity due to excess calories without serious comorbidity with body mass index (BMI) in 95th to 98th percentile for age in pediatric patient 06/29/2018   Vitamin D  deficiency 06/29/2018   Acanthosis nigricans 06/29/2018    Past Surgical History:  Procedure Laterality Date   ADENOIDECTOMY     TONSILLECTOMY      WISDOM TOOTH EXTRACTION Bilateral 2020    OB History     Gravida  0   Para  0   Term  0   Preterm  0   AB  0   Living  0      SAB  0   IAB  0   Ectopic  0   Multiple  0   Live Births  0            Home Medications    Prior to Admission medications   Medication Sig Start Date End Date Taking? Authorizing Provider  cyclobenzaprine (FLEXERIL) 10 MG tablet Take 1 tablet (10 mg total) by mouth at bedtime. 01/16/24  Yes Maryruth Sol, FNP  methocarbamol (ROBAXIN) 500 MG tablet Take 1 tablet (500 mg total) by mouth every morning. 01/16/24  Yes Maryruth Sol, FNP  naproxen  (NAPROSYN ) 500 MG tablet Take 1 tablet (500 mg total) by mouth 2 (two) times daily with a meal. 01/16/24  Yes Maryruth Sol, FNP    Family History Family History  Problem Relation Age of Onset   Stroke Maternal Grandmother    Hypertension Maternal Grandmother    Hyperlipidemia Maternal Grandmother    Diabetes type II Maternal Grandmother    Hypertension Paternal Grandmother    Heart disease Paternal Grandmother     Social History Social History   Tobacco Use   Smoking status: Never    Passive exposure: Yes   Smokeless tobacco: Never   Tobacco comments:    mom smokes outside  Advertising account planner  Vaping status: Never Used  Substance Use Topics   Alcohol use: Yes    Comment: socially   Drug use: No     Allergies   Peanut-containing drug products and Shellfish allergy   Review of Systems Review of Systems  Gastrointestinal:  Negative for nausea and vomiting.  Genitourinary:  Negative for dysuria and hematuria.  Musculoskeletal:  Positive for back pain. Negative for gait problem.  Neurological:  Negative for weakness and numbness.  All other systems reviewed and are negative.    Physical Exam Triage Vital Signs ED Triage Vitals  Encounter Vitals Group     BP 01/16/24 1301 113/68     Systolic BP Percentile --      Diastolic BP Percentile --      Pulse Rate  01/16/24 1301 88     Resp 01/16/24 1301 18     Temp 01/16/24 1301 98.3 F (36.8 C)     Temp Source 01/16/24 1301 Oral     SpO2 01/16/24 1301 98 %     Weight --      Height --      Head Circumference --      Peak Flow --      Pain Score 01/16/24 1259 9     Pain Loc --      Pain Education --      Exclude from Growth Chart --    No data found.  Updated Vital Signs BP 113/68 (BP Location: Left Arm)   Pulse 88   Temp 98.3 F (36.8 C) (Oral)   Resp 18   LMP 12/24/2023 (Exact Date)   SpO2 98%   Visual Acuity Right Eye Distance:   Left Eye Distance:   Bilateral Distance:    Right Eye Near:   Left Eye Near:    Bilateral Near:     Physical Exam Vitals reviewed.  Constitutional:      General: She is not in acute distress.    Appearance: Normal appearance. She is not ill-appearing, toxic-appearing or diaphoretic.  HENT:     Head: Normocephalic.     Mouth/Throat:     Mouth: Mucous membranes are moist.  Cardiovascular:     Rate and Rhythm: Normal rate and regular rhythm.  Pulmonary:     Effort: Pulmonary effort is normal.     Breath sounds: Normal breath sounds.  Abdominal:     Palpations: Abdomen is soft.     Tenderness: There is no right CVA tenderness or left CVA tenderness.  Musculoskeletal:        General: Normal range of motion.     Cervical back: Normal, normal range of motion and neck supple.     Thoracic back: Normal.     Lumbar back: Tenderness (right paraspinal) present. No swelling, deformity, lacerations or spasms. Normal range of motion. Negative right straight leg raise test and negative left straight leg raise test.  Skin:    General: Skin is warm and dry.  Neurological:     General: No focal deficit present.     Mental Status: She is alert and oriented to person, place, and time.     Cranial Nerves: Cranial nerves 2-12 are intact.     Sensory: Sensation is intact.     Motor: Motor function is intact. No weakness.     Coordination: Coordination is  intact.     Gait: Gait is intact.      UC Treatments / Results  Labs (all labs ordered are listed, but only  abnormal results are displayed) Labs Reviewed - No data to display  EKG   Radiology No results found.  Procedures Procedures (including critical care time)  Medications Ordered in UC Medications - No data to display  Initial Impression / Assessment and Plan / UC Course  I have reviewed the triage vital signs and the nursing notes.  Pertinent labs & imaging results that were available during my care of the patient were reviewed by me and considered in my medical decision making (see chart for details).    23 yo female presents with acute right lower back pain that began after lifting a car seat with a baby. Pain is localized without radiation and worsens with deep breathing, sneezing, coughing, and sitting. There are no neurological symptoms, urinary complaints, or gastrointestinal issues. On exam, there is tenderness to palpation in the lower right back without signs of nerve involvement. Presentation and mechanism are consistent with a lumbar muscle strain. Naproxen  was prescribed to be taken twice daily with food, and the patient was advised to stop using other over-the-counter pain medications. Supportive measures include applying heat and practicing careful movement techniques, such as turning before exiting the car or bed and using proper lifting form when handling the baby. Patient was advised to expect gradual daily improvement.  Today's evaluation has revealed no signs of a dangerous process. Discussed diagnosis with patient and/or guardian. Patient and/or guardian aware of their diagnosis, possible red flag symptoms to watch out for and need for close follow up. Patient and/or guardian understands verbal and written discharge instructions. Patient and/or guardian comfortable with plan and disposition.  Patient and/or guardian has a clear mental status at this time, good  insight into illness (after discussion and teaching) and has clear judgment to make decisions regarding their care  Documentation was completed with the aid of voice recognition software. Transcription may contain typographical errors. Final Clinical Impressions(s) / UC Diagnoses   Final diagnoses:  Strain of lumbar region, initial encounter     Discharge Instructions      You were seen today for a lumbar strain, which is an injury involving the muscles or tendons in your lower back. To help with your recovery, take all prescribed medications exactly as directed. While taking these medications, do not use over-the-counter anti-inflammatories such as aspirin, Motrin , ibuprofen , or Aleve , as this may increase the risk of side effects. If needed, you may take Tylenol  (acetaminophen ) 1000 mg every six hours for additional pain relief. This equals two 500 mg tablets at a time. Be careful not to take more than 4000 mg of Tylenol  in a 24-hour period.  To reduce pain and inflammation, alternate between applying ice and heat to the affected area throughout the day. Always place a towel between your skin and the ice pack to avoid skin damage. Avoid lifting anything heavier than 10 pounds or carrying more than 5 pounds for the next several days, as this can worsen your symptoms. Rest as needed, and follow up with your primary care or orthopedics if symptoms do not improve or if they get worse.  Go to the ED immediately if your back pain is severe, you are unable to stand or walk, you develop pain in your legs, you have weakness in your buttocks or legs., you have trouble controlling when you urinate or when you have a bowel movement, or you have frequent, painful, or bloody urination.    ED Prescriptions     Medication Sig Dispense Auth. Provider  naproxen  (NAPROSYN ) 500 MG tablet Take 1 tablet (500 mg total) by mouth 2 (two) times daily with a meal. 20 tablet Maryruth Sol, FNP   methocarbamol  (ROBAXIN) 500 MG tablet Take 1 tablet (500 mg total) by mouth every morning. 7 tablet Maryruth Sol, FNP   cyclobenzaprine (FLEXERIL) 10 MG tablet Take 1 tablet (10 mg total) by mouth at bedtime. 7 tablet Maryruth Sol, FNP      PDMP not reviewed this encounter.   Maryruth Sol, [place] 01/16/24 1432

## 2024-01-16 NOTE — ED Triage Notes (Signed)
 Pt states that yesterday around 9am she started having back pain after picking up her baby's car seat. Pt states she took OTC doan's with no relief. Pt states it is her RT lower back.

## 2024-01-16 NOTE — Discharge Instructions (Addendum)
 You were seen today for a lumbar strain, which is an injury involving the muscles or tendons in your lower back. To help with your recovery, take all prescribed medications exactly as directed. While taking these medications, do not use over-the-counter anti-inflammatories such as aspirin, Motrin , ibuprofen , or Aleve , as this may increase the risk of side effects. If needed, you may take Tylenol  (acetaminophen ) 1000 mg every six hours for additional pain relief. This equals two 500 mg tablets at a time. Be careful not to take more than 4000 mg of Tylenol  in a 24-hour period.  To reduce pain and inflammation, alternate between applying ice and heat to the affected area throughout the day. Always place a towel between your skin and the ice pack to avoid skin damage. Avoid lifting anything heavier than 10 pounds or carrying more than 5 pounds for the next several days, as this can worsen your symptoms. Rest as needed, and follow up with your primary care or orthopedics if symptoms do not improve or if they get worse.  Go to the ED immediately if your back pain is severe, you are unable to stand or walk, you develop pain in your legs, you have weakness in your buttocks or legs., you have trouble controlling when you urinate or when you have a bowel movement, or you have frequent, painful, or bloody urination.

## 2024-02-05 ENCOUNTER — Ambulatory Visit (INDEPENDENT_AMBULATORY_CARE_PROVIDER_SITE_OTHER): Admitting: Family

## 2024-02-05 VITALS — BP 125/73 | HR 101 | Temp 98.2°F | Resp 16 | Ht 65.0 in | Wt 286.0 lb

## 2024-02-05 DIAGNOSIS — G4700 Insomnia, unspecified: Secondary | ICD-10-CM | POA: Diagnosis not present

## 2024-02-05 MED ORDER — TRAZODONE HCL 50 MG PO TABS
50.0000 mg | ORAL_TABLET | Freq: Every day | ORAL | 2 refills | Status: AC
Start: 2024-02-05 — End: ?

## 2024-02-05 NOTE — Progress Notes (Signed)
 Patient ID: Rhonda Sims, female    DOB: [birth year]  MRN: [id number]  CC: Sleep  Subjective: Rhonda Sims is a 23 y.o. female who presents for sleep.   Her concerns today include:  States she is unable to sleep at nighttime. She naps during the day. States she is currently unemployed and stressed and thinks may be affecting sleep. She declines medication to help with stress. She denies thoughts of self-harm, suicidal ideations, homicidal ideations. Taking over-the-counter medication to to help with sleep with minimal relief.   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 7 tablet 0   methocarbamol  (ROBAXIN ) 500 MG tablet Take 1 tablet (500 mg total) by mouth every morning. 7 tablet 0   naproxen  (NAPROSYN ) 500 MG tablet Take 1 tablet (500 mg total) by mouth 2 (two) times daily with a meal. 20 tablet 0   No current facility-administered medications on file prior to visit.    Allergies  Allergen Reactions   Peanut-Containing Drug Products Itching   Shellfish Allergy Hives    Social History   Socioeconomic History   Marital status: Single    Spouse name: Not on file   Number of children: Not on file   Years of education: Not on file   Highest education level: Not on file  Occupational History   Not on file  Tobacco Use   Smoking status: Never    Passive exposure: Yes   Smokeless tobacco: Never   Tobacco comments:    mom smokes outside  Vaping Use   Vaping status: Never Used   Substance and Sexual Activity   Alcohol use: Yes    Comment: socially   Drug use: No   Sexual activity: Yes    Birth control/protection: None    Comment: Last encounter: 06-06-2023 (Same sex)  Other Topics Concern   Not on file  Social History Narrative   Lives with 2 cousins, brother, and mom   Social Drivers of Health   Financial Resource Strain: Low Risk  (08/12/2023)   Overall Financial Resource Strain (CARDIA)    Difficulty of Paying Living Expenses: Not hard at all  Food Insecurity: No Food Insecurity (07/03/2022)   Hunger Vital Sign    Worried About Running Out of Food in the Last Year: Never true    Ran Out of Food in the Last Year: Never true  Transportation Needs: No Transportation Needs (07/03/2022)   PRAPARE - Administrator, Civil Service (Medical): No    Lack of Transportation (Non-Medical): No  Physical Activity: Not on file  Stress: No Stress Concern Present (08/12/2023)   [employer] of Occupational Health - Occupational Stress Questionnaire    Feeling of Stress : Not at all  Social Connections: Not on file  Intimate Partner Violence: Not on file    Family History  Problem Relation Age of Onset   Stroke Maternal Grandmother    Hypertension Maternal Grandmother    Hyperlipidemia Maternal Grandmother    Diabetes type II  Maternal Grandmother    Hypertension Paternal Grandmother    Heart disease Paternal Grandmother     Past Surgical History:  Procedure Laterality Date   ADENOIDECTOMY     TONSILLECTOMY     WISDOM TOOTH EXTRACTION Bilateral 2020    ROS: Review of Systems Negative except as stated above  PHYSICAL EXAM: BP 125/73   Pulse (!) 101   Temp 98.2 F (36.8 C) (Oral)   Resp 16   Ht 5\' 5"  (1.651 m)   Wt 286 lb (129.7 kg)   LMP 12/24/2023 (Exact Date)   SpO2 95%   BMI 47.59 kg/m   Physical Exam HENT:     Head: Normocephalic and atraumatic.     Nose: Nose normal.     Mouth/Throat:     Mouth: Mucous membranes are  moist.     Pharynx: Oropharynx is clear.  Eyes:     Extraocular Movements: Extraocular movements intact.     Conjunctiva/sclera: Conjunctivae normal.     Pupils: Pupils are equal, round, and reactive to light.  Cardiovascular:     Rate and Rhythm: Tachycardia present.     Pulses: Normal pulses.     Heart sounds: Normal heart sounds.  Pulmonary:     Effort: Pulmonary effort is normal.     Breath sounds: Normal breath sounds.  Musculoskeletal:        General: Normal range of motion.     Cervical back: Normal range of motion and neck supple.  Neurological:     General: No focal deficit present.     Mental Status: She is alert and oriented to person, place, and time.  Psychiatric:        Mood and Affect: Mood normal.        Behavior: Behavior normal.     ASSESSMENT AND PLAN: 1. Insomnia, unspecified type (Primary) - Trazodone as prescribed. Counseled on medication adherence/adverse effects.  - Follow-up with primary provider in 4 weeks or sooner if needed. - traZODone (DESYREL) 50 MG tablet; Take 1 tablet (50 mg total) by mouth at bedtime.  Dispense: 30 tablet; Refill: 2   Patient was given the opportunity to ask questions.  Patient verbalized understanding of the plan and was able to repeat key elements of the plan. Patient was given clear instructions to go to Emergency Department or return to medical center if symptoms don't improve, worsen, or new problems develop.The patient verbalized understanding.   Requested Prescriptions   Signed Prescriptions Disp Refills   traZODone (DESYREL) 50 MG tablet 30 tablet 2    Sig: Take 1 tablet (50 mg total) by mouth at bedtime.    Follow-up with primary provider as scheduled.  Senaida Dama, NP

## 2024-02-05 NOTE — Progress Notes (Signed)
 Patient concern today is that she is not able to get any sleep at night. Patient has no other concerns today.

## 2024-02-15 ENCOUNTER — Other Ambulatory Visit: Payer: Self-pay

## 2024-02-15 ENCOUNTER — Emergency Department (HOSPITAL_COMMUNITY)
Admission: EM | Admit: 2024-02-15 | Discharge: 2024-02-15 | Disposition: A | Discharge status: Home / Self Care | Attending: Emergency Medicine | Admitting: Emergency Medicine

## 2024-02-15 ENCOUNTER — Emergency Department (HOSPITAL_COMMUNITY)

## 2024-02-15 ENCOUNTER — Encounter (HOSPITAL_COMMUNITY): Payer: Self-pay

## 2024-02-15 DIAGNOSIS — R1012 Left upper quadrant pain: Secondary | ICD-10-CM | POA: Insufficient documentation

## 2024-02-15 DIAGNOSIS — R1010 Upper abdominal pain, unspecified: Secondary | ICD-10-CM | POA: Diagnosis present

## 2024-02-15 DIAGNOSIS — R1013 Epigastric pain: Secondary | ICD-10-CM | POA: Insufficient documentation

## 2024-02-15 DIAGNOSIS — R1110 Vomiting, unspecified: Secondary | ICD-10-CM | POA: Insufficient documentation

## 2024-02-15 DIAGNOSIS — R1011 Right upper quadrant pain: Secondary | ICD-10-CM | POA: Insufficient documentation

## 2024-02-15 DIAGNOSIS — Z91010 Allergy to peanuts: Secondary | ICD-10-CM | POA: Diagnosis not present

## 2024-02-15 LAB — COMPREHENSIVE METABOLIC PANEL WITH GFR
ALT: 12 U/L (ref 0–44)
AST: 16 U/L (ref 15–41)
Albumin: 3.2 g/dL — ABNORMAL LOW (ref 3.5–5.0)
Alkaline Phosphatase: 86 U/L (ref 38–126)
Anion gap: 7 (ref 5–15)
BUN: 6 mg/dL (ref 6–20)
CO2: 26 mmol/L (ref 22–32)
Calcium: 8.8 mg/dL — ABNORMAL LOW (ref 8.9–10.3)
Chloride: 106 mmol/L (ref 98–111)
Creatinine, Ser: 0.77 mg/dL (ref 0.44–1.00)
GFR, Estimated: >60 mL/min (ref >60)
Glucose, Bld: 104 mg/dL — ABNORMAL HIGH (ref 70–99)
Potassium: 3.6 mmol/L (ref 3.5–5.1)
Sodium: 139 mmol/L (ref 135–145)
Total Bilirubin: 0.6 mg/dL (ref 0.0–1.2)
Total Protein: 6.9 g/dL (ref 6.5–8.1)

## 2024-02-15 LAB — CBC
HCT: 31.5 % — ABNORMAL LOW (ref 36.0–46.0)
Hemoglobin: 9.1 g/dL — ABNORMAL LOW (ref 12.0–15.0)
MCH: 19.7 pg — ABNORMAL LOW (ref 26.0–34.0)
MCHC: 28.9 g/dL — ABNORMAL LOW (ref 30.0–36.0)
MCV: 68.2 fL — ABNORMAL LOW (ref 80.0–100.0)
Platelets: 437 10*3/uL — ABNORMAL HIGH (ref 150–400)
RBC: 4.62 MIL/uL (ref 3.87–5.11)
RDW: 16.8 % — ABNORMAL HIGH (ref 11.5–15.5)
WBC: 9.6 10*3/uL (ref 4.0–10.5)
nRBC: 0 % (ref 0.0–0.2)

## 2024-02-15 LAB — URINALYSIS, ROUTINE W REFLEX MICROSCOPIC
Bilirubin Urine: NEGATIVE
Glucose, UA: NEGATIVE mg/dL
Hgb urine dipstick: NEGATIVE
Ketones, ur: NEGATIVE mg/dL
Leukocytes,Ua: NEGATIVE
Nitrite: NEGATIVE
Protein, ur: 30 mg/dL — AB
Specific Gravity, Urine: 1.027 (ref 1.005–1.030)
pH: 6 (ref 5.0–8.0)

## 2024-02-15 LAB — HCG, SERUM, QUALITATIVE: Preg, Serum: NEGATIVE

## 2024-02-15 LAB — LIPASE, BLOOD: Lipase: 27 U/L (ref 11–51)

## 2024-02-15 MED ORDER — PANTOPRAZOLE SODIUM 40 MG PO TBEC: 40.0000 mg | DELAYED_RELEASE_TABLET | Freq: Every day | ORAL | 0 refills | Status: AC

## 2024-02-15 MED ORDER — ONDANSETRON 4 MG PO TBDP: 4.0000 mg | ORAL_TABLET | Freq: Three times a day (TID) | ORAL | 0 refills | Status: DC | PRN

## 2024-02-15 MED ORDER — HYDROMORPHONE HCL 1 MG/ML IJ SOLN
0.5000 mg | Freq: Once | INTRAMUSCULAR | Status: AC
  Administered 2024-02-15: 0.5 mg via INTRAVENOUS
  Filled 2024-02-15: qty 1

## 2024-02-15 MED ORDER — SODIUM CHLORIDE 0.9 % IV BOLUS
1000.0000 mL | Freq: Once | INTRAVENOUS | Status: AC
  Administered 2024-02-15: 1000 mL via INTRAVENOUS

## 2024-02-15 MED ORDER — ALUM & MAG HYDROXIDE-SIMETH 200-200-20 MG/5ML PO SUSP
30.0000 mL | Freq: Once | ORAL | Status: AC
  Administered 2024-02-15: 30 mL via ORAL
  Filled 2024-02-15: qty 30

## 2024-02-15 MED ORDER — ONDANSETRON HCL 4 MG/2ML IJ SOLN
4.0000 mg | Freq: Once | INTRAMUSCULAR | Status: AC
  Administered 2024-02-15: 4 mg via INTRAVENOUS
  Filled 2024-02-15: qty 2

## 2024-02-15 NOTE — ED Provider Notes (Signed)
 [facility] AT [facility] Provider Note   CSN: [id number] Arrival date & time: 02/15/24  6962     History  Chief Complaint  Patient presents with   Emesis    Rhonda Sims is a 23 y.o. female.  HPI 23 year old female presents with abdominal pain and vomiting.  She has vomited 4 times since yesterday.  She has been having upper abdominal pain.  She denies fevers but has felt hot and lightheaded.  No back pain or lower abdominal pain.  No urinary or vaginal symptoms or diarrhea.  No blood in her emesis.  She is worried that it might be from fast food/door Dash that she got.  Home Medications Prior to Admission medications   Medication Sig Start Date End Date Taking? Authorizing Provider  ondansetron  (ZOFRAN -ODT) 4 MG disintegrating tablet Take 1 tablet (4 mg total) by mouth every 8 (eight) hours as needed for nausea or vomiting. 02/15/24  Yes Jerilynn Montenegro, MD  pantoprazole (PROTONIX) 40 MG tablet Take 1 tablet (40 mg total) by mouth daily. 02/15/24  Yes Jerilynn Montenegro, MD  cyclobenzaprine  (FLEXERIL ) 10 MG tablet Take 1 tablet (10 mg total) by mouth at bedtime. 01/16/24   Murrill, Samantha, FNP  methocarbamol  (ROBAXIN ) 500 MG tablet Take 1 tablet (500 mg total) by mouth every morning. 01/16/24   Murrill, Samantha, FNP  naproxen  (NAPROSYN ) 500 MG tablet Take 1 tablet (500 mg total) by mouth 2 (two) times daily with a meal. 01/16/24   Maryruth Sol, FNP  traZODone  (DESYREL ) 50 MG tablet Take 1 tablet (50 mg total) by mouth at bedtime. 02/05/24   Senaida Dama, NP      Allergies    Peanut-containing drug products and Shellfish allergy    Review of Systems   Review of Systems  Respiratory:  Negative for shortness of breath.   Gastrointestinal:  Positive for abdominal pain, nausea and vomiting. Negative for diarrhea.  Genitourinary:  Negative for dysuria, vaginal bleeding and vaginal discharge.  Musculoskeletal:  Negative for back pain.     Physical Exam Updated Vital Signs BP 128/63 (BP Location: Right Arm)   Pulse 88   Temp 98.3 F (36.8 C) (Oral)   Resp 18   Ht 5\' 5"  (1.651 m)   Wt 128.4 kg   LMP 12/24/2023 (Exact Date)   SpO2 96%   BMI 47.09 kg/m  Physical Exam Vitals and nursing note reviewed.  Constitutional:      General: She is not in acute distress.    Appearance: She is well-developed. She is obese. She is not ill-appearing or diaphoretic.  HENT:     Head: Normocephalic and atraumatic.  Cardiovascular:     Rate and Rhythm: Normal rate and regular rhythm.     Heart sounds: Normal heart sounds.  Pulmonary:     Effort: Pulmonary effort is normal.     Breath sounds: Normal breath sounds.  Abdominal:     Palpations: Abdomen is soft.     Tenderness: There is abdominal tenderness in the right upper quadrant, epigastric area and left upper quadrant.  Skin:    General: Skin is warm and dry.  Neurological:     Mental Status: She is alert.     ED Results / Procedures / Treatments   Labs (all labs ordered are listed, but only abnormal results are displayed) Labs Reviewed  COMPREHENSIVE METABOLIC PANEL WITH GFR - Abnormal; Notable for the following components:      Result Value   Glucose,  Bld 104 (*)    Calcium 8.8 (*)    Albumin 3.2 (*)    All other components within normal limits  CBC - Abnormal; Notable for the following components:   Hemoglobin 9.1 (*)    HCT 31.5 (*)    MCV 68.2 (*)    MCH 19.7 (*)    MCHC 28.9 (*)    RDW 16.8 (*)    Platelets 437 (*)    All other components within normal limits  URINALYSIS, ROUTINE W REFLEX MICROSCOPIC - Abnormal; Notable for the following components:   APPearance CLOUDY (*)    Protein, ur 30 (*)    Bacteria, UA FEW (*)    All other components within normal limits  LIPASE, BLOOD  HCG, SERUM, QUALITATIVE    EKG None  Radiology US  Abdomen Limited RUQ (LIVER/GB) Result Date: 02/15/2024 CLINICAL DATA:  [id number] Upper abdominal pain [id number] EXAM:  ULTRASOUND ABDOMEN LIMITED RIGHT UPPER QUADRANT COMPARISON:  None Available. FINDINGS: Gallbladder: No gallstones or wall thickening visualized. No sonographic Murphy sign noted by sonographer. Common bile duct: Diameter: Up to 2.3 mm.  No intrahepatic bile duct dilation. Liver: There is increased hepatic echogenicity which reduces the sensitivity of ultrasound for the detection of focal masses. That being said, no focal mass is identified. Portal vein is patent on color Doppler imaging with normal direction of blood flow towards the liver. Other: None. IMPRESSION: *Increased hepatic echogenicity, a nonspecific finding that is most commonly seen on the basis of steatosis in the absence of known liver disease. Otherwise unremarkable exam. Electronically Signed   By: Beula Brunswick M.D.   On: 02/15/2024 08:59    Procedures Procedures    Medications Ordered in ED Medications  sodium chloride 0.9 % bolus 1,000 mL (0 mLs Intravenous Stopped 02/15/24 1000)  ondansetron  (ZOFRAN ) injection 4 mg (4 mg Intravenous Given 02/15/24 0900)  HYDROmorphone (DILAUDID) injection 0.5 mg (0.5 mg Intravenous Given 02/15/24 0901)  alum & mag hydroxide-simeth (MAALOX/MYLANTA) 200-200-20 MG/5ML suspension 30 mL (30 mLs Oral Given 02/15/24 0454)    ED Course/ Medical Decision Making/ A&P                                 Medical Decision Making Amount and/or Complexity of Data Reviewed Labs: ordered.    Details: Normal WBC Stable anemia Radiology: ordered and independent interpretation performed.    Details: No cholelithiasis  Risk OTC drugs. Prescription drug management.   Patient presents with upper abdominal pain and vomiting.  Could be a gastroenteritis given her concern for recent food causing it.  Could be gastritis in general.  Either way she is feeling a lot better and her workup is reassuring including a normal WBC and an unremarkable right upper quadrant ultrasound for acute pathology.  She was given some  Maalox in addition to the pain and nausea medicine she had been given while in the ED.  She has been able to tolerate p.o.  Overall low suspicion for emergent abdominal pathology such as bowel obstruction, appendicitis, etc.  Will discharge with return precautions.        Final Clinical Impression(s) / ED Diagnoses Final diagnoses:  Upper abdominal pain    Rx / DC Orders ED Discharge Orders          Ordered    pantoprazole (PROTONIX) 40 MG tablet  Daily        02/15/24 0954    ondansetron  (ZOFRAN -ODT) 4  MG disintegrating tablet  Every 8 hours PRN        02/15/24 0954              Jerilynn Montenegro, MD 02/15/24 1006

## 2024-02-15 NOTE — Discharge Instructions (Addendum)
 Follow-up with your primary care provider in regards your abdominal pain.  We are starting you on an antiacid medicine called Protonix.  It would be best to avoid alcohol, NSAIDs such as ibuprofen , Advil , Aleve , naproxen , etc.  If you develop worsening, continued, or recurrent abdominal pain, uncontrolled vomiting, fever, chest or back pain, or any other new/concerning symptoms then return to the ER for evaluation.

## 2024-02-15 NOTE — ED Triage Notes (Signed)
 Pt complaining of vomiting twice yesterday. Happened after she ate [employer]jangles and drank tea that she had door dashed.

## 2024-02-15 NOTE — ED Notes (Signed)
 Patient discharged by RN. Patient ambulatory to lobby. No additional questions for RN.

## 2024-04-20 ENCOUNTER — Ambulatory Visit (HOSPITAL_COMMUNITY): Payer: Self-pay

## 2024-05-24 ENCOUNTER — Ambulatory Visit (HOSPITAL_COMMUNITY)
Admission: EM | Admit: 2024-05-24 | Discharge: 2024-05-24 | Disposition: A | Discharge status: Home / Self Care | Attending: Family Medicine | Admitting: Family Medicine

## 2024-05-24 ENCOUNTER — Encounter (HOSPITAL_COMMUNITY): Payer: Self-pay

## 2024-05-24 DIAGNOSIS — M79642 Pain in left hand: Secondary | ICD-10-CM

## 2024-05-24 MED ORDER — PREDNISONE 50 MG PO TABS: ORAL_TABLET | ORAL | 0 refills | Status: DC

## 2024-05-24 NOTE — ED Triage Notes (Addendum)
 Pt states left  hand/wrist pain states it starts at her thumb and goes into her wrist ans sometimes it goes numb. Denies any injury.   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 States she has not been taking anything at home for the pain.

## 2024-05-24 NOTE — ED Provider Notes (Signed)
 [facility]   [id number] 05/24/24 Arrival Time: 1444  ASSESSMENT & PLAN:  1. Left hand pain    Non-traumatic. Suspect overuse; discussed.  Trial of: Discharge Medication List as of 05/24/2024  4:22 PM     START taking these medications   Details  predniSONE  (DELTASONE ) 50 MG tablet Take one tablet by mouth for 5 days., Normal       Orders Placed This Encounter  Procedures   Apply Wrist brace with ABD Thumb  To wear for next one week. Work/school excuse note: provided. Recommend:  Follow-up Information     Schedule an appointment as soon as possible for a visit  with Lorren Greig PARAS, NP.   Specialty: Nurse Practitioner Why: For follow up. Contact information: [address] [employer] [address] [place] [address] [phone]         [facility] [place].   Specialty: Urgent Care Why: As needed. Contact information: [address] [place]  [address] [phone]                Reviewed expectations re: course of current medical issues. Questions answered. Outlined signs and symptoms indicating need for more acute intervention. Patient verbalized understanding. After Visit Summary given.  SUBJECTIVE: History from: patient. Rhonda Sims is a 23 y.o. female who reports LEFT hand/wrist pain; starts at her thumb and goes into her wrist; with occas tingling sensation in fingers. 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 No tx PTA.  Past Surgical History:  Procedure Laterality Date   ADENOIDECTOMY     TONSILLECTOMY     WISDOM TOOTH EXTRACTION Bilateral 2020      OBJECTIVE:  Vitals:   05/24/24 1535  BP: 121/72  Pulse: 84  Resp: 16  Temp: 98.7 F (37.1 C)  TempSrc: Oral  SpO2: 98%    General appearance: alert; no distress HEENT: [place]; AT Neck: supple with FROM Resp: unlabored respirations Extremities: LUE: warm with well perfused appearance; pain reported over radial  side of wrist that goes to inside of palm at thumb; more notable with thumb and wrist movement; no erythema or inflammation; no swelling; FROM; very tender over radial styloid CV: brisk extremity capillary refill of LUE; 2+ radial pulse of LUE. Skin: warm and dry; no visible rashes Neurologic: gait normal; normal sensation and strength of LUE Psychological: alert and cooperative; normal mood and affect  Imaging: No results found.    Allergies  Allergen Reactions   Peanut-Containing Drug Products Itching   Shellfish Allergy Hives    Past Medical History:  Diagnosis Date   Asthma    Seasonal allergies    Social History   Socioeconomic History   Marital status: Single    Spouse name: Not on file   Number of children: Not on file   Years of education: Not on file   Highest education level: Not on file  Occupational History   Not on file  Tobacco Use   Smoking status: Never    Passive exposure: Yes   Smokeless tobacco: Never   Tobacco comments:    mom smokes outside  Vaping Use   Vaping status: Never Used  Substance and Sexual Activity   Alcohol use: Yes    Comment: socially   Drug use: No   Sexual activity: Yes    Birth control/protection: None    Comment: Last encounter: 06-06-2023 (Same sex)  Other Topics Concern   Not on file  Social History Narrative  Lives with 2 cousins, brother, and mom   Social Drivers of Health   Financial Resource Strain: Low Risk  (08/12/2023)   Overall Financial Resource Strain (CARDIA)    Difficulty of Paying Living Expenses: Not hard at all  Food Insecurity: No Food Insecurity (07/03/2022)   Hunger Vital Sign    Worried About Running Out of Food in the Last Year: Never true    Ran Out of Food in the Last Year: Never true  Transportation Needs: No Transportation Needs (07/03/2022)   PRAPARE - Administrator, Civil Service (Medical): No    Lack of Transportation (Non-Medical): No  Physical Activity: Not on file   Stress: No Stress Concern Present (08/12/2023)   [employer] of Occupational Health - Occupational Stress Questionnaire    Feeling of Stress : Not at all  Social Connections: Not on file   Family History  Problem Relation Age of Onset   Stroke Maternal Grandmother    Hypertension Maternal Grandmother    Hyperlipidemia Maternal Grandmother    Diabetes type II Maternal Grandmother    Hypertension Paternal Grandmother    Heart disease Paternal Grandmother    Past Surgical History:  Procedure Laterality Date   ADENOIDECTOMY     TONSILLECTOMY     WISDOM TOOTH EXTRACTION Bilateral 2020       Rolinda Rogue, MD 05/24/24 1640

## 2024-06-09 ENCOUNTER — Encounter: Payer: Self-pay | Admitting: Family

## 2024-06-09 ENCOUNTER — Telehealth: Payer: Self-pay | Admitting: *Deleted

## 2024-06-09 ENCOUNTER — Encounter

## 2024-06-09 NOTE — Telephone Encounter (Signed)
 Reason for CRM: Patient calling in because she needs physical and tb shot by October 16th, 2025 and Amy doesn't have anything before then. 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 Can she schedule with another provider?        Call back #[phone]

## 2024-06-09 NOTE — Telephone Encounter (Signed)
 No appts available with any provider at [facility] within the requested dates. Called pt and left vm with information on [facility] and advised pt they accept walk-in pts. Sent a Wellsite geologist with [facility] calendar.

## 2024-06-14 ENCOUNTER — Ambulatory Visit: Admitting: Physician Assistant

## 2024-06-14 ENCOUNTER — Encounter: Payer: Self-pay | Admitting: Physician Assistant

## 2024-06-14 VITALS — BP 125/74 | HR 89 | Ht 65.0 in | Wt 282.0 lb

## 2024-06-14 DIAGNOSIS — Z021 Encounter for pre-employment examination: Secondary | ICD-10-CM

## 2024-06-14 DIAGNOSIS — Z111 Encounter for screening for respiratory tuberculosis: Secondary | ICD-10-CM

## 2024-06-14 NOTE — Progress Notes (Addendum)
 Established Patient Office Visit  Subjective   Patient ID: Rhonda Sims, female    DOB: 2001/07/03  Age: 23 y.o. MRN: [id number]  Chief Complaint  Patient presents with   pre employment physical  Discussed the use of AI scribe software for clinical note transcription with the patient, who gave verbal consent to proceed.  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 She requires a physical for employment at [employer]. She has no back or knee pain. She does not have any current health concerns and does not take regular medications.       Past Medical History:  Diagnosis Date   Asthma    Seasonal allergies    Social History   Socioeconomic History   Marital status: Single    Spouse name: Not on file   Number of children: Not on file   Years of education: Not on file   Highest education level: Not on file  Occupational History   Not on file  Tobacco Use   Smoking status: Never    Passive exposure: Yes   Smokeless tobacco: Never   Tobacco comments:    mom smokes outside  Vaping Use   Vaping status: Never Used  Substance and Sexual Activity   Alcohol use: Yes    Comment: socially   Drug use: No   Sexual activity: Yes    Birth control/protection: None    Comment: Last encounter: 06-06-2023 (Same sex)  Other Topics Concern   Not on file  Social History Narrative   Lives with 2 cousins, brother, and mom   Social Drivers of Health   Financial Resource Strain: Low Risk  (08/12/2023)   Overall Financial Resource Strain (CARDIA)    Difficulty of Paying Living Expenses: Not hard at all  Food Insecurity: No Food Insecurity (07/03/2022)   Hunger Vital Sign    Worried About Running Out of Food in the Last Year: Never true    Ran Out of Food in the Last  Year: Never true  Transportation Needs: No Transportation Needs (07/03/2022)   PRAPARE - Administrator, Civil Service (Medical): No    Lack of Transportation (Non-Medical): No  Physical Activity: Not on file  Stress: No Stress Concern Present (08/12/2023)   [employer] of Occupational Health - Occupational Stress Questionnaire    Feeling of Stress : Not at all  Social Connections: Not on file  Intimate Partner Violence: Not on file   Family History  Problem Relation Age of Onset   Stroke Maternal Grandmother    Hypertension Maternal Grandmother    Hyperlipidemia Maternal Grandmother    Diabetes type II Maternal Grandmother    Hypertension Paternal Grandmother    Heart disease Paternal Grandmother    Allergies  Allergen Reactions   Peanut-Containing Drug Products Itching   Shellfish Allergy Hives    Review of Systems  Constitutional: Negative.   HENT: Negative.    Eyes: Negative.   Respiratory:  Negative for shortness of breath.   Cardiovascular:  Negative for chest pain.  Gastrointestinal: Negative.   Genitourinary: Negative.   Musculoskeletal: Negative.   Skin: Negative.   Neurological: Negative.   Endo/Heme/Allergies: Negative.   Psychiatric/Behavioral: Negative.        Objective:     BP 125/74 (  BP Location: Left Arm, Patient Position: Sitting, Cuff Size: Large)   Pulse 89   Ht 5' 5 (1.651 m)   Wt 282 lb (127.9 kg)   LMP 04/16/2024 (Exact Date)   SpO2 98%   BMI 46.93 kg/m  BP Readings from Last 3 Encounters:  06/14/24 125/74  05/24/24 121/72  02/15/24 128/63   Wt Readings from Last 3 Encounters:  06/14/24 282 lb (127.9 kg)  02/15/24 283 lb (128.4 kg)  02/05/24 286 lb (129.7 kg)    Physical Exam Vitals and nursing note reviewed.    GENERAL: Alert, cooperative, well developed, no acute distress HEENT: Normocephalic, normal oropharynx, moist mucous membranes CHEST: Clear to auscultation bilaterally, no wheezes, rhonchi, or  crackles CARDIOVASCULAR: Normal heart rate and rhythm, S1 and S2 normal without murmurs EXTREMITIES: No cyanosis or edema NEUROLOGICAL: Cranial nerves grossly intact, moves all extremities without gross motor or sensory deficit   Assessment & Plan:   Problem List Items Addressed This Visit   None Visit Diagnoses       Physical exam, pre-employment    -  Primary     Screening-pulmonary TB       Relevant Orders   QuantiFERON-TB Gold Plus     1. Physical exam, pre-employment (Primary) No paperwork presented by patient to be completed.  Patient education given on supportive care.  Unable to complete TB test for patient due to lack of supplies.  Patient to present to community [facility] wellness center to have this completed.  Patient understands and agrees.  2. Screening-pulmonary TB  - QuantiFERON-TB Gold Plus; Future   I have reviewed the patient's medical history (PMH, PSH, Social History, Family History, Medications, and allergies) , and have been updated if relevant. I spent 20 minutes reviewing chart and  face to face time with patient.     Return if symptoms worsen or fail to improve.    Kirk RAMAN Mayers, PA-C

## 2024-06-14 NOTE — Patient Instructions (Signed)
 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   Health Maintenance, Female Adopting a healthy lifestyle and getting preventive care are important in promoting health and wellness. Ask your health care provider about: The right schedule for you to have regular tests and exams. Things you can do on your own to prevent diseases and keep yourself healthy. What should I know about diet, weight, and exercise? Eat a healthy diet  Eat a diet that includes plenty of vegetables, fruits, low-fat dairy products, and lean protein. Do not eat a lot of foods that are high in solid fats, added sugars, or sodium. Maintain a healthy weight Body mass index (BMI) is used to identify weight problems. It estimates body fat based on height and weight. Your health care provider can help determine your BMI and help you achieve or maintain a healthy weight. Get regular exercise Get regular exercise. This is one of the most important things you can do for your health. Most adults should: Exercise for at least 150 minutes each week. The exercise should increase your heart rate and make you sweat (moderate-intensity exercise). Do strengthening exercises at least twice a week. This is in addition to the moderate-intensity exercise. Spend less time sitting. Even light physical activity can be beneficial. Watch cholesterol and blood lipids Have your blood tested for lipids and cholesterol at 22 years of age, then have this test every 5 years. Have your cholesterol levels checked more often if: Your lipid or cholesterol levels are high. You are older than 22 years of age. You are at high risk for heart disease. What should I know about cancer screening? Depending on your health history and family history, you may  need to have cancer screening at various ages. This may include screening for: Breast cancer. Cervical cancer. Colorectal cancer. Skin cancer. Lung cancer. What should I know about heart disease, diabetes, and high blood pressure? Blood pressure and heart disease High blood pressure causes heart disease and increases the risk of stroke. This is more likely to develop in people who have high blood pressure readings or are overweight. Have your blood pressure checked: Every 3-5 years if you are 31-47 years of age. Every year if you are 79 years old or older. Diabetes Have regular diabetes screenings. This checks your fasting blood sugar level. Have the screening done: Once every three years after age 71 if you are at a normal weight and have a low risk for diabetes. More often and at a younger age if you are overweight or have a high risk for diabetes. What should I know about preventing infection? Hepatitis B If you have a higher risk for hepatitis B, you should be screened for this virus. Talk with your health care provider to find out if you are at risk for hepatitis B infection. Hepatitis C Testing is recommended for: Everyone born from [birth year] through 1964. Anyone with known risk factors for hepatitis C. Sexually transmitted infections (STIs) Get screened for STIs, including gonorrhea and chlamydia, if: You are sexually active and are younger than 22 years of age. You are older than 22 years of age and your health care provider tells you that you are at risk for this type of infection. Your sexual activity has changed since you were last screened, and you are at increased risk for  chlamydia or gonorrhea. Ask your health care provider if you are at risk. Ask your health care provider about whether you are at high risk for HIV. Your health care provider may recommend a prescription medicine to help prevent HIV infection. If you choose to take medicine to prevent HIV, you should first get  tested for HIV. You should then be tested every 3 months for as long as you are taking the medicine. Pregnancy If you are about to stop having your period (premenopausal) and you may become pregnant, seek counseling before you get pregnant. Take 400 to 800 micrograms (mcg) of folic acid every day if you become pregnant. Ask for birth control (contraception) if you want to prevent pregnancy. Osteoporosis and menopause Osteoporosis is a disease in which the bones lose minerals and strength with aging. This can result in bone fractures. If you are 66 years old or older, or if you are at risk for osteoporosis and fractures, ask your health care provider if you should: Be screened for bone loss. Take a calcium or vitamin D  supplement to lower your risk of fractures. Be given hormone replacement therapy (HRT) to treat symptoms of menopause. Follow these instructions at home: Alcohol use Do not drink alcohol if: Your health care provider tells you not to drink. You are pregnant, may be pregnant, or are planning to become pregnant. If you drink alcohol: Limit how much you have to: 0-1 drink a day. Know how much alcohol is in your drink. In the [place] one drink equals one 12 oz bottle of beer (355 mL), one 5 oz glass of wine (148 mL), or one 1 oz glass of hard liquor (44 mL). Lifestyle Do not use any products that contain nicotine or tobacco. These products include cigarettes, chewing tobacco, and vaping devices, such as e-cigarettes. If you need help quitting, ask your health care provider. Do not use street drugs. Do not share needles. Ask your health care provider for help if you need support or information about quitting drugs. General instructions Schedule regular health, dental, and eye exams. Stay current with your vaccines. Tell your health care provider if: You often feel depressed. You have ever been abused or do not feel safe at home. Summary Adopting a healthy lifestyle and getting  preventive care are important in promoting health and wellness. Follow your health care provider's instructions about healthy diet, exercising, and getting tested or screened for diseases. Follow your health care provider's instructions on monitoring your cholesterol and blood pressure. This information is not intended to replace advice given to you by your health care provider. Make sure you discuss any questions you have with your health care provider. Document Revised: 01/14/2021 Document Reviewed: 01/14/2021 Elsevier Patient Education  2024 [employer].

## 2024-06-22 ENCOUNTER — Ambulatory Visit

## 2024-06-22 DIAGNOSIS — Z111 Encounter for screening for respiratory tuberculosis: Secondary | ICD-10-CM | POA: Diagnosis not present

## 2024-06-22 NOTE — Progress Notes (Signed)
 PPD test given on left forearm  Patient area of the 48-72 hr time frame  [facility]

## 2024-06-24 ENCOUNTER — Ambulatory Visit: Attending: Family

## 2024-06-24 LAB — TB SKIN TEST
Induration: 0 mm
TB Skin Test: NEGATIVE

## 2024-07-19 ENCOUNTER — Ambulatory Visit: Admitting: Family

## 2024-08-08 ENCOUNTER — Encounter (HOSPITAL_COMMUNITY): Payer: Self-pay | Admitting: Emergency Medicine

## 2024-08-08 ENCOUNTER — Ambulatory Visit (HOSPITAL_COMMUNITY)
Admission: EM | Admit: 2024-08-08 | Discharge: 2024-08-08 | Disposition: A | Discharge status: Home / Self Care | Attending: Internal Medicine | Admitting: Internal Medicine

## 2024-08-08 ENCOUNTER — Other Ambulatory Visit: Payer: Self-pay

## 2024-08-08 DIAGNOSIS — J310 Chronic rhinitis: Secondary | ICD-10-CM | POA: Diagnosis not present

## 2024-08-08 DIAGNOSIS — N898 Other specified noninflammatory disorders of vagina: Secondary | ICD-10-CM | POA: Diagnosis not present

## 2024-08-08 LAB — POCT URINE PREGNANCY: Preg Test, Ur: NEGATIVE

## 2024-08-08 MED ORDER — FLUTICASONE PROPIONATE 50 MCG/ACT NA SUSP: 2.0000 | Freq: Every day | NASAL | 2 refills | Status: DC

## 2024-08-08 MED ORDER — NYSTATIN 100000 UNIT/GM EX CREA: TOPICAL_CREAM | CUTANEOUS | 0 refills | Status: AC

## 2024-08-08 MED ORDER — AMOXICILLIN-POT CLAVULANATE 875-125 MG PO TABS: 1.0000 | ORAL_TABLET | Freq: Two times a day (BID) | ORAL | 0 refills | Status: DC

## 2024-08-08 MED ORDER — FLUCONAZOLE 150 MG PO TABS: 150.0000 mg | ORAL_TABLET | Freq: Every day | ORAL | 0 refills | Status: AC

## 2024-08-08 NOTE — ED Triage Notes (Signed)
 Complains of not feeling well.  Complains of cough.  Complains of vaginal itching. Denies discharge.  Patient reports she shaved 3 days ago, but this is not the first time shaving and it usually does not itch patient has used Vaseline

## 2024-08-08 NOTE — Discharge Instructions (Addendum)
 Symptoms and physical exam findings are most consistent with a purulent rhinitis. This has been going on for a week now.  Due to the severity and duration we will treat with antibiotics by mouth and Flonase.  The vaginal symptoms do sound like a vaginal yeast infection.  We have done a swab today that we will check for vaginal yeast as well as bacterial vaginitis.  These results will take approximately 24 to 48 hours to finalize.  If there is any further treatment needed we will contact you. Urine pregnancy test done today was negative.  Due to the symptoms and the upcoming course of antibiotics we will go ahead and treat with Diflucan .  We have also called in topical medication called nystatin to help with external itching.  We are treating with the following: Augmentin 875 mg twice daily for 7 days.  This is an antibiotic.  Take this with food.  Flonase 2 sprays each nostril once daily for 7 days for nasal congestion.  May use as needed after this.  Diflucan  150 mg take 1 tablet now and then repeat in 3 days.  Nystatin cream twice daily for external itching for 7 days Make sure to stay hydrated by drinking plenty of water. Return to urgent care or PCP if symptoms worsen or fail to resolve.

## 2024-08-08 NOTE — ED Provider Notes (Signed)
 [facility]    CSN: [id number] Arrival date & time: 08/08/24  1804      History   Chief Complaint Chief Complaint  Patient presents with   Vaginal Itching    HPI Rhonda Sims is a 23 y.o. female.   23 year old female presents urgent care with complaints of sinus congestion, postnasal drip and vaginal itching.  She reports she was also having a cough.  Her symptoms started about a week ago.  It has not gotten much better.  Her cough is much worse at night when she has drainage down the back of her throat from her sinuses.  She has vomited due to the cough and mucus.  She denies fevers, chills, abdominal pain, dysuria, hematuria, vaginal discharge or vaginal pain.  She is having some vaginal itching that is both external and internal.  She does relate that she is trying to get pregnant but took a pregnancy test 2 days ago that was negative.   Vaginal Itching Pertinent negatives include no chest pain, no abdominal pain and no shortness of breath.    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   Anxiety and depression 01/07/2022   Obesity due to excess calories without serious comorbidity with body mass index (BMI) in 95th to 98th percentile for age in pediatric patient 06/29/2018   Vitamin D  deficiency 06/29/2018   Acanthosis nigricans 06/29/2018    Past Surgical History:  Procedure Laterality Date   ADENOIDECTOMY     TONSILLECTOMY     WISDOM TOOTH EXTRACTION Bilateral 2020    OB History     Gravida  0   Para  0   Term  0   Preterm  0   AB  0   Living  0      SAB  0   IAB  0   Ectopic  0   Multiple  0   Live Births  0            Home Medications    Prior to Admission medications   Medication Sig Start Date End Date Taking? Authorizing Provider  amoxicillin -clavulanate (AUGMENTIN ) 875-125 MG tablet Take  1 tablet by mouth every 12 (twelve) hours. 08/08/24  Yes Jacarra Bobak A, PA-C  fluconazole  (DIFLUCAN ) 150 MG tablet Take 1 tablet (150 mg total) by mouth daily for 2 days. take 1 tablet now and then repeat in 3 days 08/08/24 08/10/24 Yes Kelden Lavallee, Almarie LABOR, PA-C  fluticasone  (FLONASE ) 50 MCG/ACT nasal spray Place 2 sprays into both nostrils daily. 08/08/24  Yes Zealand Boyett A, PA-C  nystatin  cream (MYCOSTATIN ) Apply to affected area 2 times daily 08/08/24  Yes Jonn Chaikin A, PA-C  cyclobenzaprine  (FLEXERIL ) 10 MG tablet Take 1 tablet (10 mg total) by mouth at bedtime. Patient not taking: Reported on 08/08/2024 01/16/24   Murrill, Samantha, FNP  methocarbamol  (ROBAXIN ) 500 MG tablet Take 1 tablet (500 mg total) by mouth every morning. 01/16/24   Murrill, Samantha, FNP  naproxen  (NAPROSYN ) 500 MG tablet Take 1 tablet (500 mg total) by mouth 2 (two) times daily with a meal. 01/16/24   Iola Lukes, FNP  ondansetron  (ZOFRAN -ODT) 4 MG disintegrating tablet Take 1 tablet (4 mg total) by mouth every 8 (eight) hours as needed for nausea or vomiting. 02/15/24   Freddi Hamilton, MD  pantoprazole  (PROTONIX ) 40 MG tablet Take  1 tablet (40 mg total) by mouth daily. 02/15/24   Freddi Hamilton, MD  predniSONE  (DELTASONE ) 50 MG tablet Take one tablet by mouth for 5 days. 05/24/24   Rolinda Rogue, MD  traZODone  (DESYREL ) 50 MG tablet Take 1 tablet (50 mg total) by mouth at bedtime. 02/05/24   Jaycee Greig PARAS, NP    Family History Family History  Problem Relation Age of Onset   Stroke Maternal Grandmother    Hypertension Maternal Grandmother    Hyperlipidemia Maternal Grandmother    Diabetes type II Maternal Grandmother    Hypertension Paternal Grandmother    Heart disease Paternal Grandmother     Social History Social History   Tobacco Use   Smoking status: Never    Passive exposure: Yes   Smokeless tobacco: Never   Tobacco comments:    mom smokes outside  Vaping Use   Vaping status: Never Used   Substance Use Topics   Alcohol use: Yes    Comment: socially   Drug use: Yes    Types: Marijuana     Allergies   Peanut-containing drug products and Shellfish allergy   Review of Systems Review of Systems  Constitutional:  Negative for chills and fever.  HENT:  Positive for congestion, postnasal drip, rhinorrhea and sinus pressure. Negative for ear pain and sore throat.   Eyes:  Negative for pain and visual disturbance.  Respiratory:  Negative for cough and shortness of breath.   Cardiovascular:  Negative for chest pain and palpitations.  Gastrointestinal:  Negative for abdominal pain and vomiting.  Genitourinary:  Negative for dysuria, frequency, genital sores, hematuria, vaginal bleeding, vaginal discharge and vaginal pain.       Vaginal itching  Musculoskeletal:  Negative for arthralgias and back pain.  Skin:  Negative for color change and rash.  Neurological:  Negative for seizures and syncope.  All other systems reviewed and are negative.    Physical Exam Triage Vital Signs ED Triage Vitals  Encounter Vitals Group     BP 08/08/24 1937 131/64     Girls Systolic BP Percentile --      Girls Diastolic BP Percentile --      Boys Systolic BP Percentile --      Boys Diastolic BP Percentile --      Pulse Rate 08/08/24 1937 (!) 102     Resp 08/08/24 1937 20     Temp 08/08/24 1937 98.1 F (36.7 C)     Temp Source 08/08/24 1937 Oral     SpO2 08/08/24 1937 98 %     Weight --      Height --      Head Circumference --      Peak Flow --      Pain Score 08/08/24 1934 0     Pain Loc --      Pain Education --      Exclude from Growth Chart --    No data found.  Updated Vital Signs BP 131/64 (BP Location: Left Arm)   Pulse (!) 102   Temp 98.1 F (36.7 C) (Oral)   Resp 20   LMP 06/28/2024 (Approximate)   SpO2 98%   Visual Acuity Right Eye Distance:   Left Eye Distance:   Bilateral Distance:    Right Eye Near:   Left Eye Near:    Bilateral Near:     Physical  Exam   UC Treatments / Results  Labs (all labs ordered are listed, but only abnormal results are displayed)  Labs Reviewed  POCT URINE PREGNANCY  CERVICOVAGINAL ANCILLARY ONLY    EKG   Radiology No results found.  Procedures Procedures (including critical care time)  Medications Ordered in UC Medications - No data to display  Initial Impression / Assessment and Plan / UC Course  I have reviewed the triage vital signs and the nursing notes.  Pertinent labs & imaging results that were available during my care of the patient were reviewed by me and considered in my medical decision making (see chart for details).     Vagina itching - Plan: POCT urine pregnancy, POCT urine pregnancy  Purulent rhinitis   Symptoms and physical exam findings are most consistent with a purulent rhinitis. This has been going on for a week now.  Due to the severity and duration we will treat with antibiotics by mouth and Flonase.  The vaginal symptoms do sound like a vaginal yeast infection.  We have done a swab today that we will check for vaginal yeast as well as bacterial vaginitis.  These results will take approximately 24 to 48 hours to finalize.  If there is any further treatment needed we will contact you. Urine pregnancy test done today was negative.  Due to the symptoms and the upcoming course of antibiotics we will go ahead and treat with Diflucan .  We have also called in topical medication called nystatin to help with external itching.  We are treating with the following: Augmentin 875 mg twice daily for 7 days.  This is an antibiotic.  Take this with food.  Flonase 2 sprays each nostril once daily for 7 days for nasal congestion.  May use as needed after this.  Diflucan  150 mg take 1 tablet now and then repeat in 3 days.  Nystatin cream twice daily for external itching for 7 days Make sure to stay hydrated by drinking plenty of water. Return to urgent care or PCP if symptoms worsen or fail to  resolve.    Final Clinical Impressions(s) / UC Diagnoses   Final diagnoses:  Vagina itching  Purulent rhinitis     Discharge Instructions      Symptoms and physical exam findings are most consistent with a purulent rhinitis. This has been going on for a week now.  Due to the severity and duration we will treat with antibiotics by mouth and Flonase.  The vaginal symptoms do sound like a vaginal yeast infection.  We have done a swab today that we will check for vaginal yeast as well as bacterial vaginitis.  These results will take approximately 24 to 48 hours to finalize.  If there is any further treatment needed we will contact you. Urine pregnancy test done today was negative.  Due to the symptoms and the upcoming course of antibiotics we will go ahead and treat with Diflucan .  We have also called in topical medication called nystatin to help with external itching.  We are treating with the following: Augmentin 875 mg twice daily for 7 days.  This is an antibiotic.  Take this with food.  Flonase 2 sprays each nostril once daily for 7 days for nasal congestion.  May use as needed after this.  Diflucan  150 mg take 1 tablet now and then repeat in 3 days.  Nystatin cream twice daily for external itching for 7 days Make sure to stay hydrated by drinking plenty of water. Return to urgent care or PCP if symptoms worsen or fail to resolve.      ED Prescriptions  Medication Sig Dispense Auth. Provider   amoxicillin -clavulanate (AUGMENTIN) 875-125 MG tablet Take 1 tablet by mouth every 12 (twelve) hours. 14 tablet Timmothy Baranowski A, PA-C   fluconazole  (DIFLUCAN ) 150 MG tablet Take 1 tablet (150 mg total) by mouth daily for 2 days. take 1 tablet now and then repeat in 3 days 2 tablet Abhinav Mayorquin A, PA-C   nystatin cream (MYCOSTATIN) Apply to affected area 2 times daily 30 g Ellee Wawrzyniak A, PA-C   fluticasone (FLONASE) 50 MCG/ACT nasal spray Place 2 sprays into both nostrils daily. 9.9  mL Teresa Almarie LABOR, [place]      PDMP not reviewed this encounter.   Teresa Almarie LABOR, PA-C 08/08/24 2054

## 2024-08-09 ENCOUNTER — Ambulatory Visit (HOSPITAL_COMMUNITY): Payer: Self-pay

## 2024-08-09 LAB — CERVICOVAGINAL ANCILLARY ONLY
Bacterial Vaginitis (gardnerella): NEGATIVE
Candida Glabrata: NEGATIVE
Candida Vaginitis: POSITIVE — AB
Chlamydia: NEGATIVE
Comment: NEGATIVE
Comment: NEGATIVE
Comment: NEGATIVE
Comment: NEGATIVE
Comment: NEGATIVE
Comment: NORMAL
Neisseria Gonorrhea: NEGATIVE
Trichomonas: NEGATIVE

## 2024-08-19 ENCOUNTER — Telehealth: Payer: Self-pay | Admitting: Family

## 2024-08-19 NOTE — Telephone Encounter (Signed)
 Called pt for clarity. Pt states that she has a physical form that needs to be filled out for her job and needs an attached immunization record with it reflecting the shots she got on physical appt. Pt also needs the OV notes for that day. The [facility] with Kirk Sage, NP. Does pt need an appt for this physical form to be filled out with Amy? Or does pt need to visit the [facility] so [facility] can fill the paperwork?

## 2024-08-19 NOTE — Telephone Encounter (Signed)
 Copied from CRM #[id number]. Topic: General - Other >> Aug 17, 2024  2:06 PM Mesmerise C wrote: Reason for CRM: Patient states need actual paperwork for physical and the shots she got done for her job states the paperwork for her physical that was sent was a summary but they need actual detailed visit needs it done asap

## 2024-08-22 NOTE — Telephone Encounter (Signed)
 I called patient and made her aware that Kirk Senters and fill physical form out and patient wanted to know where they would be on Thursday.  I gave patient information [employer] [address] and I made her awrae that I could give her the OV notes and immunization record

## 2024-09-15 ENCOUNTER — Ambulatory Visit (HOSPITAL_COMMUNITY)
Admission: EM | Admit: 2024-09-15 | Discharge: 2024-09-15 | Disposition: A | Discharge status: Home / Self Care | Attending: Emergency Medicine | Admitting: Emergency Medicine

## 2024-09-15 ENCOUNTER — Encounter (HOSPITAL_COMMUNITY): Payer: Self-pay

## 2024-09-15 DIAGNOSIS — R197 Diarrhea, unspecified: Secondary | ICD-10-CM | POA: Diagnosis not present

## 2024-09-15 DIAGNOSIS — R112 Nausea with vomiting, unspecified: Secondary | ICD-10-CM | POA: Diagnosis not present

## 2024-09-15 DIAGNOSIS — Z3202 Encounter for pregnancy test, result negative: Secondary | ICD-10-CM

## 2024-09-15 LAB — POCT URINE PREGNANCY: Preg Test, Ur: NEGATIVE

## 2024-09-15 MED ORDER — ONDANSETRON 4 MG PO TBDP
4.0000 mg | ORAL_TABLET | Freq: Once | ORAL | Status: AC
  Administered 2024-09-15: 4 mg via ORAL

## 2024-09-15 MED ORDER — ONDANSETRON HCL 4 MG PO TABS: 4.0000 mg | ORAL_TABLET | Freq: Three times a day (TID) | ORAL | 0 refills | Status: DC | PRN

## 2024-09-15 MED ORDER — ONDANSETRON 4 MG PO TBDP
ORAL_TABLET | ORAL | Status: AC
  Filled 2024-09-15: qty 1

## 2024-09-15 NOTE — ED Triage Notes (Signed)
 Pt states woke up at 7am with diarrhea. States while at work drank ginger ale and vomit x3. States having upper abdominal pain.SABRA denies taken any meds. States will need a work note.

## 2024-09-15 NOTE — Discharge Instructions (Addendum)
 Your urine pregnancy test was negative.  I suspect you have food poisoning.  Please follow a liquid diet throughout the rest of the day with sips of water, ginger ale and broth.  If this goes well tomorrow you can transition to solid foods such as bland diet with bananas, rice, toast and applesauce.  Ensure you are staying well-hydrated once he can hold down food and liquids.  Return to clinic for any concerning symptoms or continued symptoms.

## 2024-09-15 NOTE — ED Provider Notes (Signed)
 " [facility]    CSN: [id number] Arrival date & time: 09/15/24  1230      History   Chief Complaint Chief Complaint  Patient presents with   Emesis   Diarrhea    HPI Rhonda Sims is a 24 y.o. female.   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  She did take a urine pregnancy test at home 2 days ago and this was negative    The history is provided by the patient and medical records.  Emesis Diarrhea   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   Anxiety and depression 01/07/2022   Obesity due to excess calories without serious comorbidity with body mass index (BMI) in 95th to 98th percentile for age in pediatric patient 06/29/2018   Vitamin D  deficiency 06/29/2018   Acanthosis nigricans 06/29/2018    Past Surgical History:  Procedure Laterality Date   ADENOIDECTOMY     TONSILLECTOMY     WISDOM TOOTH EXTRACTION Bilateral 2020    OB History     Gravida  0   Para  0   Term  0   Preterm  0   AB  0   Living  0      SAB  0   IAB  0   Ectopic  0   Multiple  0   Live Births  0            Home Medications    Prior to Admission medications  Medication Sig Start Date End Date Taking? Authorizing Provider   ondansetron  (ZOFRAN ) 4 MG tablet Take 1 tablet (4 mg total) by mouth every 8 (eight) hours as needed for nausea or vomiting. 09/15/24  Yes Danniel Tones  N, FNP  cyclobenzaprine  (FLEXERIL ) 10 MG tablet Take 1 tablet (10 mg total) by mouth at bedtime. Patient not taking: Reported on 08/08/2024 01/16/24   Iola Lukes, FNP  fluticasone  (FLONASE ) 50 MCG/ACT nasal spray Place 2 sprays into both nostrils daily. 08/08/24   White, Elizabeth A, PA-C  methocarbamol  (ROBAXIN ) 500 MG tablet Take 1 tablet (500 mg total) by mouth every morning. 01/16/24   Murrill, Samantha, FNP  naproxen  (NAPROSYN ) 500 MG tablet Take 1 tablet (500 mg total) by mouth 2 (two) times daily with a meal. 01/16/24   Iola Lukes, FNP  nystatin  cream (MYCOSTATIN ) Apply to affected area 2 times daily 08/08/24   Teresa Norris A, PA-C  pantoprazole  (PROTONIX ) 40 MG tablet Take 1 tablet (40 mg total) by mouth daily. 02/15/24   Freddi Hamilton, MD  traZODone  (DESYREL ) 50 MG tablet Take 1 tablet (50 mg total) by mouth at bedtime. 02/05/24   Jaycee Greig PARAS, NP  Family History Family History  Problem Relation Age of Onset   Stroke Maternal Grandmother    Hypertension Maternal Grandmother    Hyperlipidemia Maternal Grandmother    Diabetes type II Maternal Grandmother    Hypertension Paternal Grandmother    Heart disease Paternal Grandmother     Social History Social History[1]   Allergies   Peanut-containing drug products and Shellfish allergy   Review of Systems Review of Systems  Per HPI  Physical Exam Triage Vital Signs ED Triage Vitals  Encounter Vitals Group     BP 09/15/24 1333 112/67     Girls Systolic BP Percentile --      Girls Diastolic BP Percentile --      Boys Systolic BP Percentile --      Boys Diastolic BP Percentile --      Pulse Rate 09/15/24 1333 (!) 104     Resp 09/15/24 1333 16     Temp 09/15/24 1333 98.2 F (36.8 C)     Temp Source 09/15/24 1333 Oral     SpO2 09/15/24 1333 99 %      Weight --      Height --      Head Circumference --      Peak Flow --      Pain Score 09/15/24 1330 9     Pain Loc --      Pain Education --      Exclude from Growth Chart --    No data found.  Updated Vital Signs BP 112/67 (BP Location: Left Arm)   Pulse (!) 104   Temp 98.2 F (36.8 C) (Oral)   Resp 16   LMP 08/13/2024   SpO2 99%   Visual Acuity Right Eye Distance:   Left Eye Distance:   Bilateral Distance:    Right Eye Near:   Left Eye Near:    Bilateral Near:     Physical Exam Vitals and nursing note reviewed.  Constitutional:      Appearance: Normal appearance.  HENT:     Head: Normocephalic and atraumatic.     Right Ear: External ear normal.     Left Ear: External ear normal.     Nose: Nose normal.     Mouth/Throat:     Mouth: Mucous membranes are moist.  Eyes:     Conjunctiva/sclera: Conjunctivae normal.  Cardiovascular:     Rate and Rhythm: Normal rate.  Pulmonary:     Effort: Pulmonary effort is normal. No respiratory distress.  Abdominal:     General: Abdomen is flat.     Palpations: Abdomen is soft.     Tenderness: There is abdominal tenderness in the epigastric area. There is no guarding or rebound.     Comments: Endorsed epigastric tenderness after abdominal exam was completed, without rebound or guarding  Skin:    General: Skin is warm and dry.  Neurological:     General: No focal deficit present.     Mental Status: She is alert.  Psychiatric:        Mood and Affect: Mood normal.        Behavior: Behavior is cooperative.      UC Treatments / Results  Labs (all labs ordered are listed, but only abnormal results are displayed) Labs Reviewed  POCT URINE PREGNANCY    EKG   Radiology No results found.  Procedures Procedures (including critical care time)  Medications Ordered in UC Medications  ondansetron  (ZOFRAN -ODT) disintegrating tablet 4 mg (4 mg Oral Given  09/15/24 1444)    Initial Impression / Assessment and Plan / UC  Course  I have reviewed the triage vital signs and the nursing notes.  Pertinent labs & imaging results that were available during my care of the patient were reviewed by me and considered in my medical decision making (see chart for details).  Vitals and triage reviewed, patient is hemodynamically stable.  Abdomen is soft with active bowel sounds.  Endorsed mild epigastric tenderness.  Without rebound or guarding.  Without clinical evidence of acute abdomen.  Given ODT Zofran  in clinic.  Suspect food poisoning versus viral gastroenteritis.  Symptomatic management discussed.  Appears insurance would not cover ODT Zofran , tablets sent in.  Symptomatic management with bland diet and oral rehydration encouraged.    Plan of care, follow-up care, and return precautions given, no questions at this time.  Work note provided.     Final Clinical Impressions(s) / UC Diagnoses   Final diagnoses:  Nausea vomiting and diarrhea  Urine pregnancy test negative     Discharge Instructions      Your urine pregnancy test was negative.  I suspect you have food poisoning.  Please follow a liquid diet throughout the rest of the day with sips of water, ginger ale and broth.  If this goes well tomorrow you can transition to solid foods such as bland diet with bananas, rice, toast and applesauce.  Ensure you are staying well-hydrated once he can hold down food and liquids.  Return to clinic for any concerning symptoms or continued symptoms.      ED Prescriptions     Medication Sig Dispense Auth. Provider   ondansetron  (ZOFRAN ) 4 MG tablet Take 1 tablet (4 mg total) by mouth every 8 (eight) hours as needed for nausea or vomiting. 20 tablet Dreama, Danilo Cappiello  N, FNP      PDMP not reviewed this encounter.     [1]  Social History Tobacco Use   Smoking status: Never    Passive exposure: Yes   Smokeless tobacco: Never   Tobacco comments:    mom smokes outside  Vaping Use   Vaping status: Never  Used  Substance Use Topics   Alcohol use: Yes    Comment: socially   Drug use: Yes    Types: Marijuana     Dreama, Savannah Morford  N, FNP 09/15/24 1455  "

## 2024-09-26 ENCOUNTER — Emergency Department (HOSPITAL_COMMUNITY)
Admission: EM | Admit: 2024-09-26 | Discharge: 2024-09-26 | Disposition: A | Discharge status: Home / Self Care | Attending: Emergency Medicine | Admitting: Emergency Medicine

## 2024-09-26 ENCOUNTER — Encounter (HOSPITAL_COMMUNITY): Payer: Self-pay

## 2024-09-26 ENCOUNTER — Telehealth (HOSPITAL_COMMUNITY): Payer: Self-pay | Admitting: *Deleted

## 2024-09-26 ENCOUNTER — Other Ambulatory Visit: Payer: Self-pay

## 2024-09-26 DIAGNOSIS — Z91010 Allergy to peanuts: Secondary | ICD-10-CM | POA: Insufficient documentation

## 2024-09-26 DIAGNOSIS — J101 Influenza due to other identified influenza virus with other respiratory manifestations: Secondary | ICD-10-CM | POA: Diagnosis not present

## 2024-09-26 DIAGNOSIS — R059 Cough, unspecified: Secondary | ICD-10-CM | POA: Diagnosis present

## 2024-09-26 DIAGNOSIS — J45909 Unspecified asthma, uncomplicated: Secondary | ICD-10-CM | POA: Insufficient documentation

## 2024-09-26 LAB — COMPREHENSIVE METABOLIC PANEL WITH GFR
ALT: 9 U/L (ref 0–44)
AST: 19 U/L (ref 15–41)
Albumin: 4.1 g/dL (ref 3.5–5.0)
Alkaline Phosphatase: 91 U/L (ref 38–126)
Anion gap: 10 (ref 5–15)
BUN: 6 mg/dL (ref 6–20)
CO2: 23 mmol/L (ref 22–32)
Calcium: 8.5 mg/dL — ABNORMAL LOW (ref 8.9–10.3)
Chloride: 101 mmol/L (ref 98–111)
Creatinine, Ser: 0.79 mg/dL (ref 0.44–1.00)
GFR, Estimated: >60 mL/min
Glucose, Bld: 128 mg/dL — ABNORMAL HIGH (ref 70–99)
Potassium: 3.8 mmol/L (ref 3.5–5.1)
Sodium: 134 mmol/L — ABNORMAL LOW (ref 135–145)
Total Bilirubin: 0.2 mg/dL (ref 0.0–1.2)
Total Protein: 7.5 g/dL (ref 6.5–8.1)

## 2024-09-26 LAB — CBC
HCT: 35.8 % — ABNORMAL LOW (ref 36.0–46.0)
Hemoglobin: 10.4 g/dL — ABNORMAL LOW (ref 12.0–15.0)
MCH: 19.4 pg — ABNORMAL LOW (ref 26.0–34.0)
MCHC: 29.1 g/dL — ABNORMAL LOW (ref 30.0–36.0)
MCV: 66.7 fL — ABNORMAL LOW (ref 80.0–100.0)
Platelets: 507 K/uL — ABNORMAL HIGH (ref 150–400)
RBC: 5.37 MIL/uL — ABNORMAL HIGH (ref 3.87–5.11)
RDW: 17.2 % — ABNORMAL HIGH (ref 11.5–15.5)
WBC: 5.6 K/uL (ref 4.0–10.5)
nRBC: 0 % (ref 0.0–0.2)

## 2024-09-26 LAB — RESP PANEL BY RT-PCR (RSV, FLU A&B, COVID)  RVPGX2
Influenza A by PCR: POSITIVE — AB
Influenza B by PCR: NEGATIVE
Resp Syncytial Virus by PCR: NEGATIVE
SARS Coronavirus 2 by RT PCR: NEGATIVE

## 2024-09-26 LAB — LIPASE, BLOOD: Lipase: 29 U/L (ref 11–51)

## 2024-09-26 LAB — HCG, SERUM, QUALITATIVE: Preg, Serum: NEGATIVE

## 2024-09-26 MED ORDER — ONDANSETRON 4 MG PO TBDP: 4.0000 mg | ORAL_TABLET | Freq: Three times a day (TID) | ORAL | 0 refills | Status: AC | PRN

## 2024-09-26 MED ORDER — ACETAMINOPHEN 500 MG PO TABS
1000.0000 mg | ORAL_TABLET | Freq: Once | ORAL | Status: AC
  Administered 2024-09-26: 1000 mg via ORAL
  Filled 2024-09-26: qty 2

## 2024-09-26 MED ORDER — ONDANSETRON 4 MG PO TBDP
4.0000 mg | ORAL_TABLET | Freq: Once | ORAL | Status: AC | PRN
  Administered 2024-09-26: 4 mg via ORAL
  Filled 2024-09-26: qty 1

## 2024-09-26 NOTE — ED Provider Notes (Signed)
 "  EMERGENCY DEPARTMENT AT [facility] Provider Note   CSN: [id number] Arrival date & time: 09/26/24  9462     Patient presents with: Emesis   Rhonda Sims is a 24 y.o. female.   24 year old female presents today for concern of cough, congestion, myalgias, nausea, vomiting since Friday night.  She works at [employer].  Has been taking Tylenol  cough and cold without relief.  She has a bottle of sweet tea at bedside.  Denies any chest pain.  Has history of asthma but no wheezing.  The history is provided by the patient. No language interpreter was used.       Prior to Admission medications  Medication Sig Start Date End Date Taking? Authorizing Provider  cyclobenzaprine  (FLEXERIL ) 10 MG tablet Take 1 tablet (10 mg total) by mouth at bedtime. Patient not taking: Reported on 08/08/2024 01/16/24   Iola Lukes, FNP  fluticasone  (FLONASE ) 50 MCG/ACT nasal spray Place 2 sprays into both nostrils daily. 08/08/24   White, Elizabeth A, PA-C  methocarbamol  (ROBAXIN ) 500 MG tablet Take 1 tablet (500 mg total) by mouth every morning. 01/16/24   Murrill, Samantha, FNP  naproxen  (NAPROSYN ) 500 MG tablet Take 1 tablet (500 mg total) by mouth 2 (two) times daily with a meal. 01/16/24   Iola Lukes, FNP  nystatin  cream (MYCOSTATIN ) Apply to affected area 2 times daily 08/08/24   Teresa Norris A, PA-C  ondansetron  (ZOFRAN ) 4 MG tablet Take 1 tablet (4 mg total) by mouth every 8 (eight) hours as needed for nausea or vomiting. 09/15/24   Ball, [place]  G, FNP  pantoprazole  (PROTONIX ) 40 MG tablet Take 1 tablet (40 mg total) by mouth daily. 02/15/24   Freddi Hamilton, MD  traZODone  (DESYREL ) 50 MG tablet Take 1 tablet (50 mg total) by mouth at bedtime. 02/05/24   Jaycee Greig PARAS, NP    Allergies: Peanut-containing drug products and Shellfish allergy    Review of Systems  Constitutional:  Positive for chills and fever.  Respiratory:  Positive for cough. Negative for shortness of  breath and wheezing.   Cardiovascular:  Negative for chest pain.  Neurological:  Negative for light-headedness.  All other systems reviewed and are negative.   Updated Vital Signs BP 101/68   Pulse (!) 103   Temp (!) 102.1 F (38.9 C) (Oral)   Resp (!) 22   Ht 5' 5 (1.651 m)   Wt 127 kg   LMP 08/13/2024   SpO2 100%   BMI 46.59 kg/m   Physical Exam Vitals and nursing note reviewed.  Constitutional:      General: She is not in acute distress.    Appearance: Normal appearance. She is not ill-appearing.  HENT:     Head: Normocephalic and atraumatic.     Nose: Nose normal.  Eyes:     Conjunctiva/sclera: Conjunctivae normal.  Cardiovascular:     Rate and Rhythm: Normal rate and regular rhythm.  Pulmonary:     Effort: Pulmonary effort is normal. No respiratory distress.     Breath sounds: Normal breath sounds. No wheezing.  Musculoskeletal:        General: No deformity. Normal range of motion.     Cervical back: Normal range of motion.  Skin:    Findings: No rash.  Neurological:     Mental Status: She is alert.     (all labs ordered are listed, but only abnormal results are displayed) Labs Reviewed  RESP PANEL BY RT-PCR (RSV, FLU A&B, COVID)  RVPGX2 - Abnormal; Notable for the following components:      Result Value   Influenza A by PCR POSITIVE (*)    All other components within normal limits  COMPREHENSIVE METABOLIC PANEL WITH GFR - Abnormal; Notable for the following components:   Sodium 134 (*)    Glucose, Bld 128 (*)    Calcium 8.5 (*)    All other components within normal limits  CBC - Abnormal; Notable for the following components:   RBC 5.37 (*)    Hemoglobin 10.4 (*)    HCT 35.8 (*)    MCV 66.7 (*)    MCH 19.4 (*)    MCHC 29.1 (*)    RDW 17.2 (*)    Platelets 507 (*)    All other components within normal limits  LIPASE, BLOOD  HCG, SERUM, QUALITATIVE  URINALYSIS, ROUTINE W REFLEX MICROSCOPIC    EKG: None  Radiology: No results  found.   Procedures   Medications Ordered in the ED  ondansetron  (ZOFRAN -ODT) disintegrating tablet 4 mg (4 mg Oral Given 09/26/24 0602)  acetaminophen  (TYLENOL ) tablet 1,000 mg (1,000 mg Oral Given 09/26/24 0755)    Clinical Course as of 09/26/24 0839  Mon Sep 26, 2024  0736 Patient evaluated.  Appears uncomfortable.  Temperature 102.1 on my exam.  Will give dose of Tylenol .  Will provide p.o. hydration.  Will reevaluate.  She is influenza A positive.  CMP without acute finding.  Sodium of 134 but not critical.  CBC with anemia at hemoglobin of 10.4 but this is around her baseline.  Pregnancy test negative. [AA]  [id number] Reevaluation heart rate in the mid 90s.  Patient feeling improved and tolerating p.o. intake. [AA]    Clinical Course User Index [AA] Hildegard Loge, PA-C                                 Medical Decision Making Amount and/or Complexity of Data Reviewed Labs: ordered.  Risk OTC drugs. Prescription drug management.   Medical Decision Making / ED Course   This patient presents to the ED for concern of flulike symptoms, this involves an extensive number of treatment options, and is a complaint that carries with it a high risk of complications and morbidity.  The differential diagnosis includes viral URI, influenza A, pneumonia,  MDM: 24 year old female presents today for concern of URI symptoms that have been ongoing since Friday night.  Her respiratory panel is positive for influenza A.  Blood work overall reassuring.  Tachycardic but this improved after oral hydration in the emergency department and after dose of Tylenol . Lung sounds are clear.  Cough is nonproductive.  Low suspicion for pneumonia Supportive care discussed. Patient outside of the window for Tamiflu . Patient discharged in stable condition.  Return precautions discussed.  She voices understanding and is in agreement with plan. Advised her to increase her fluid intake but reduce caffeine intake.  Lab  Tests: -I ordered, reviewed, and interpreted labs.   The pertinent results include:   Labs Reviewed  RESP PANEL BY RT-PCR (RSV, FLU A&B, COVID)  RVPGX2 - Abnormal; Notable for the following components:      Result Value   Influenza A by PCR POSITIVE (*)    All other components within normal limits  COMPREHENSIVE METABOLIC PANEL WITH GFR - Abnormal; Notable for the following components:   Sodium 134 (*)    Glucose, Bld 128 (*)    Calcium  8.5 (*)    All other components within normal limits  CBC - Abnormal; Notable for the following components:   RBC 5.37 (*)    Hemoglobin 10.4 (*)    HCT 35.8 (*)    MCV 66.7 (*)    MCH 19.4 (*)    MCHC 29.1 (*)    RDW 17.2 (*)    Platelets 507 (*)    All other components within normal limits  LIPASE, BLOOD  HCG, SERUM, QUALITATIVE  URINALYSIS, ROUTINE W REFLEX MICROSCOPIC      EKG  EKG Interpretation Date/Time:    Ventricular Rate:    PR Interval:    QRS Duration:    QT Interval:    QTC Calculation:   R Axis:      Text Interpretation:           Medicines ordered and prescription drug management: Meds ordered this encounter  Medications   ondansetron  (ZOFRAN -ODT) disintegrating tablet 4 mg   acetaminophen  (TYLENOL ) tablet 1,000 mg    -I have reviewed the patients home medicines and have made adjustments as needed    Reevaluation: After the interventions noted above, I reevaluated the patient and found that they have :improved  Co morbidities that complicate the patient evaluation  Past Medical History:  Diagnosis Date   Asthma    Seasonal allergies       Dispostion: Discharged in stable condition.  Return precaution discussed.   Final diagnoses:  Influenza A    ED Discharge Orders          Ordered    ondansetron  (ZOFRAN -ODT) 4 MG disintegrating tablet  Every 8 hours PRN        09/26/24 0838               Hildegard Loge, PA-C 09/26/24 9160    Yolande Lamar BROCKS, MD 09/26/24 [phone]  "

## 2024-09-26 NOTE — Telephone Encounter (Signed)
 Pt called regarding original work excuse having return to work date 07/30/2025 instead if 09/29/2024. RNCM corrected in system and placed in pt Mychart.  Carinne Brandenburger J. Debarah, BSN, RN, [facility]  Inpatient Care Management  Nurse Case Manager  [facility] Emergency Departments  Operative Services  [phone]

## 2024-09-26 NOTE — Discharge Instructions (Signed)
 Your workup today showed that you have influenza A.  Take Tylenol  1000 mg every 6 hours not exceeding 4000 mg in a 24-hour period.  Take ibuprofen  600 mg every 6-8 hours.  Both of these medications should help with fever and bodyaches.  Drink plenty of fluids including electrolyte drinks and water.  Cut back on caffeine including coffee, soft drinks, sweet tea as these will dehydrate you.  Return for any emergent symptoms.  Nausea medication has been sent to your pharmacy of choice.

## 2024-09-26 NOTE — ED Triage Notes (Signed)
 Pt complaining of nausea, vomiting, cough, sneezing, congestion, chest discomfort and muscle aches since Friday night.

## 2024-09-26 NOTE — ED Notes (Signed)
 Provided patient ice water per Provider request

## 2024-09-29 ENCOUNTER — Encounter (HOSPITAL_COMMUNITY): Payer: Self-pay | Admitting: Emergency Medicine

## 2024-09-29 ENCOUNTER — Emergency Department (HOSPITAL_COMMUNITY)

## 2024-09-29 ENCOUNTER — Ambulatory Visit (INDEPENDENT_AMBULATORY_CARE_PROVIDER_SITE_OTHER): Admitting: Primary Care

## 2024-09-29 ENCOUNTER — Other Ambulatory Visit: Payer: Self-pay

## 2024-09-29 ENCOUNTER — Ambulatory Visit: Payer: Self-pay

## 2024-09-29 ENCOUNTER — Emergency Department (HOSPITAL_COMMUNITY)
Admission: EM | Admit: 2024-09-29 | Discharge: 2024-09-29 | Disposition: A | Discharge status: Home / Self Care | Attending: Emergency Medicine | Admitting: Emergency Medicine

## 2024-09-29 VITALS — BP 119/83 | HR 78 | Temp 97.9°F | Resp 16 | Ht 65.0 in | Wt 274.2 lb

## 2024-09-29 DIAGNOSIS — R0981 Nasal congestion: Secondary | ICD-10-CM

## 2024-09-29 DIAGNOSIS — B349 Viral infection, unspecified: Secondary | ICD-10-CM | POA: Diagnosis not present

## 2024-09-29 DIAGNOSIS — J9801 Acute bronchospasm: Secondary | ICD-10-CM | POA: Diagnosis not present

## 2024-09-29 DIAGNOSIS — J45909 Unspecified asthma, uncomplicated: Secondary | ICD-10-CM | POA: Insufficient documentation

## 2024-09-29 DIAGNOSIS — J111 Influenza due to unidentified influenza virus with other respiratory manifestations: Secondary | ICD-10-CM | POA: Diagnosis not present

## 2024-09-29 DIAGNOSIS — Z91010 Allergy to peanuts: Secondary | ICD-10-CM | POA: Diagnosis not present

## 2024-09-29 DIAGNOSIS — K2900 Acute gastritis without bleeding: Secondary | ICD-10-CM | POA: Diagnosis not present

## 2024-09-29 DIAGNOSIS — R059 Cough, unspecified: Secondary | ICD-10-CM | POA: Diagnosis present

## 2024-09-29 LAB — CBC WITH DIFFERENTIAL/PLATELET
Abs Immature Granulocytes: 0.01 K/uL (ref 0.00–0.07)
Basophils Absolute: 0 K/uL (ref 0.0–0.1)
Basophils Relative: 0 %
Eosinophils Absolute: 0 K/uL (ref 0.0–0.5)
Eosinophils Relative: 0 %
HCT: 33.5 % — ABNORMAL LOW (ref 36.0–46.0)
Hemoglobin: 9.8 g/dL — ABNORMAL LOW (ref 12.0–15.0)
Immature Granulocytes: 0 %
Lymphocytes Relative: 56 %
Lymphs Abs: 3.3 K/uL (ref 0.7–4.0)
MCH: 19.5 pg — ABNORMAL LOW (ref 26.0–34.0)
MCHC: 29.3 g/dL — ABNORMAL LOW (ref 30.0–36.0)
MCV: 66.6 fL — ABNORMAL LOW (ref 80.0–100.0)
Monocytes Absolute: 0.3 K/uL (ref 0.1–1.0)
Monocytes Relative: 6 %
Neutro Abs: 2.2 K/uL (ref 1.7–7.7)
Neutrophils Relative %: 38 %
Platelets: 488 K/uL — ABNORMAL HIGH (ref 150–400)
RBC: 5.03 MIL/uL (ref 3.87–5.11)
RDW: 17.3 % — ABNORMAL HIGH (ref 11.5–15.5)
Smear Review: NORMAL
WBC: 5.9 K/uL (ref 4.0–10.5)
nRBC: 0 % (ref 0.0–0.2)

## 2024-09-29 LAB — URINALYSIS, W/ REFLEX TO CULTURE (INFECTION SUSPECTED)
Bacteria, UA: NONE SEEN
Bilirubin Urine: NEGATIVE
Glucose, UA: NEGATIVE mg/dL
Hgb urine dipstick: NEGATIVE
Ketones, ur: 5 mg/dL — AB
Nitrite: NEGATIVE
Protein, ur: 30 mg/dL — AB
Specific Gravity, Urine: 1.031 — ABNORMAL HIGH (ref 1.005–1.030)
pH: 6 (ref 5.0–8.0)

## 2024-09-29 LAB — COMPREHENSIVE METABOLIC PANEL WITH GFR
ALT: 10 U/L (ref 0–44)
AST: 22 U/L (ref 15–41)
Albumin: 3.8 g/dL (ref 3.5–5.0)
Alkaline Phosphatase: 78 U/L (ref 38–126)
Anion gap: 9 (ref 5–15)
BUN: 8 mg/dL (ref 6–20)
CO2: 26 mmol/L (ref 22–32)
Calcium: 8.9 mg/dL (ref 8.9–10.3)
Chloride: 105 mmol/L (ref 98–111)
Creatinine, Ser: 0.67 mg/dL (ref 0.44–1.00)
GFR, Estimated: >60 mL/min
Glucose, Bld: 94 mg/dL (ref 70–99)
Potassium: 3.9 mmol/L (ref 3.5–5.1)
Sodium: 139 mmol/L (ref 135–145)
Total Bilirubin: 0.3 mg/dL (ref 0.0–1.2)
Total Protein: 7.3 g/dL (ref 6.5–8.1)

## 2024-09-29 LAB — RAPID HIV SCREEN (HIV 1/2 AB+AG)
HIV 1/2 Antibodies: NONREACTIVE
HIV-1 P24 Antigen - HIV24: NONREACTIVE

## 2024-09-29 LAB — LIPASE, BLOOD: Lipase: 15 U/L (ref 11–51)

## 2024-09-29 LAB — PREGNANCY, URINE: Preg Test, Ur: NEGATIVE

## 2024-09-29 MED ORDER — FAMOTIDINE 40 MG PO TABS: 40.0000 mg | ORAL_TABLET | Freq: Every day | ORAL | 0 refills | Status: AC

## 2024-09-29 MED ORDER — PANTOPRAZOLE SODIUM 40 MG PO TBEC
40.0000 mg | DELAYED_RELEASE_TABLET | Freq: Once | ORAL | Status: AC
  Administered 2024-09-29: 40 mg via ORAL
  Filled 2024-09-29: qty 1

## 2024-09-29 MED ORDER — FLUTICASONE PROPIONATE 50 MCG/ACT NA SUSP: 2.0000 | Freq: Every day | NASAL | 2 refills | Status: AC

## 2024-09-29 MED ORDER — ALUM & MAG HYDROXIDE-SIMETH 200-200-20 MG/5ML PO SUSP
15.0000 mL | Freq: Once | ORAL | Status: AC
  Administered 2024-09-29: 15 mL via ORAL
  Filled 2024-09-29: qty 30

## 2024-09-29 MED ORDER — ALBUTEROL SULFATE HFA 108 (90 BASE) MCG/ACT IN AERS
2.0000 | INHALATION_SPRAY | Freq: Once | RESPIRATORY_TRACT | Status: AC
  Administered 2024-09-29: 2 via RESPIRATORY_TRACT
  Filled 2024-09-29: qty 6.7

## 2024-09-29 MED ORDER — OSELTAMIVIR PHOSPHATE 75 MG PO CAPS: 75.0000 mg | ORAL_CAPSULE | Freq: Two times a day (BID) | ORAL | 0 refills | Status: AC

## 2024-09-29 MED ORDER — ALBUTEROL SULFATE HFA 108 (90 BASE) MCG/ACT IN AERS: 2.0000 | INHALATION_SPRAY | Freq: Four times a day (QID) | RESPIRATORY_TRACT | 2 refills | Status: AC | PRN

## 2024-09-29 NOTE — ED Triage Notes (Signed)
 Pt reports flu x 7 days, seen by primary care today and told she may have exacerbated her childhood asthma and given nasal spray. Pt reports her mom feels she needs HIV/STI screening as she has had a hard time getting over her illness.

## 2024-09-29 NOTE — Telephone Encounter (Signed)
 noted

## 2024-09-29 NOTE — Patient Instructions (Signed)

## 2024-09-29 NOTE — Discharge Instructions (Signed)
 While you were in the emergency room, you had blood work done that overall was normal.  Your HIV test was negative.  You will be contacted if your syphilis screening is positive.  Your ultrasound and chest x-ray were negative.  You likely have some inflammation of your stomach.  I have sent a prescription for you for medicine called famotidine .  This will help out with the burning sensation you are having with your stomach.  Follow-up with your primary care doctor within 2 to 3 weeks.

## 2024-09-29 NOTE — ED Notes (Signed)
 X RAY at bedside

## 2024-09-29 NOTE — ED Provider Notes (Signed)
 " [place] EMERGENCY DEPARTMENT AT [facility] Provider Note  CSN: [id number] Arrival date & time: 09/29/24 1609  Chief Complaint(s) Illness  HPI Rhonda Sims is a 24 y.o. female who is here today because she has not felt well since her recent flu diagnosis.  She reports that she has had a cough, has also had some epigastric burning, is also concerned about potential of STI exposure.  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 Authorizing Provider  famotidine  (PEPCID ) 40 MG tablet Take 1 tablet (40 mg total) by mouth daily. 09/29/24 10/29/24 Yes Mannie Fairy DASEN, DO  albuterol  (VENTOLIN  HFA) 108 [phone] Base) MCG/ACT inhaler Inhale 2 puffs into the lungs every 6 (six) hours as needed for wheezing or shortness of breath. 09/29/24   Celestia Rosaline SQUIBB, NP  cyclobenzaprine  (FLEXERIL ) 10 MG tablet Take 1 tablet (10 mg total) by mouth at bedtime. Patient not taking: Reported on 08/08/2024 01/16/24   Iola Lukes, FNP  fluticasone  (FLONASE ) 50 MCG/ACT nasal spray Place 2 sprays into both nostrils daily. 09/29/24   Celestia Rosaline SQUIBB, NP  methocarbamol  (ROBAXIN ) 500 MG tablet Take 1 tablet (500 mg total) by mouth every morning. 01/16/24   Murrill, Samantha, FNP  naproxen  (NAPROSYN ) 500 MG tablet Take 1 tablet (500 mg total) by mouth 2 (two) times daily with a meal. 01/16/24   Iola Lukes, FNP  nystatin  cream (MYCOSTATIN ) Apply to affected area 2 times daily 08/08/24   Teresa Norris  A, PA-C  ondansetron  (ZOFRAN -ODT) 4 MG disintegrating tablet Take 1 tablet (4 mg total) by mouth every 8 (eight) hours as needed. 09/26/24   Hildegard Loge, PA-C  oseltamivir  (TAMIFLU ) 75 MG capsule Take 1 capsule (75 mg total) by mouth 2 (two) times daily. 09/29/24   Celestia Rosaline SQUIBB, NP  pantoprazole  (PROTONIX ) 40 MG tablet Take 1 tablet (40 mg total) by mouth daily. 02/15/24   Freddi Hamilton, MD  traZODone  (DESYREL ) 50 MG tablet Take 1 tablet (50 mg total) by mouth at bedtime. 02/05/24   Jaycee Greig PARAS, NP                                                                                                                                    Past Surgical History Past Surgical History:  Procedure Laterality Date   ADENOIDECTOMY     TONSILLECTOMY     WISDOM TOOTH EXTRACTION Bilateral 2020   Family History Family  History  Problem Relation Age of Onset   Stroke Maternal Grandmother    Hypertension Maternal Grandmother    Hyperlipidemia Maternal Grandmother    Diabetes type II Maternal Grandmother    Hypertension Paternal Grandmother    Heart disease Paternal Grandmother     Social History Social History[1] Allergies Peanut-containing drug products and Shellfish allergy  Review of Systems Review of Systems  Physical Exam Vital Signs  I have reviewed the triage vital signs BP 115/83 (BP Location: Right Arm)   Pulse 73   Temp 98.7 F (37.1 C) (Oral)   Resp 18   LMP 08/13/2024   SpO2 100%   Physical Exam Vitals and nursing note reviewed.  Constitutional:      Appearance: Normal appearance.  HENT:     Head: Normocephalic and atraumatic.     Mouth/Throat:     Mouth: Mucous membranes are moist.     Pharynx: No oropharyngeal exudate or posterior oropharyngeal erythema.  Eyes:     Pupils: Pupils are equal, round, and reactive to light.  Cardiovascular:     Rate and Rhythm: Normal rate.  Pulmonary:     Effort: Pulmonary effort is normal.  Abdominal:     General: Abdomen is flat.   Musculoskeletal:        General: Normal range of motion.     Cervical back: Normal range of motion.  Skin:    General: Skin is warm.  Neurological:     General: No focal deficit present.     Mental Status: She is alert.     ED Results and Treatments Labs (all labs ordered are listed, but only abnormal results are displayed) Labs Reviewed  URINALYSIS, W/ REFLEX TO CULTURE (INFECTION SUSPECTED) - Abnormal; Notable for the following components:      Result Value   APPearance HAZY (*)    Specific Gravity, Urine 1.031 (*)    Ketones, ur 5 (*)    Protein, ur 30 (*)    Leukocytes,Ua TRACE (*)    All other components within normal limits  CBC WITH DIFFERENTIAL/PLATELET - Abnormal; Notable for the following components:   Hemoglobin 9.8 (*)    HCT 33.5 (*)    MCV 66.6 (*)    MCH 19.5 (*)    MCHC 29.3 (*)    RDW 17.3 (*)    Platelets 488 (*)    All other components within normal limits  COMPREHENSIVE METABOLIC PANEL WITH GFR  RAPID HIV SCREEN (HIV 1/2 AB+AG)  PREGNANCY, URINE  LIPASE, BLOOD  CBC WITH DIFFERENTIAL/PLATELET  SYPHILIS: RPR W/REFLEX TO RPR TITER AND TREPONEMAL ANTIBODIES, TRADITIONAL SCREENING AND DIAGNOSIS ALGORITHM  GC/CHLAMYDIA PROBE AMP ([place]) NOT AT [facility]                                                                                                                          Radiology DG Chest Portable 1 View Result Date: 09/29/2024 EXAM: 1 VIEW(S) XRAY OF THE CHEST 09/29/2024 06:07:54 PM  COMPARISON: None available. CLINICAL HISTORY: Cough. FINDINGS: LUNGS AND PLEURA: No focal pulmonary opacity. No pleural effusion. No pneumothorax. HEART AND MEDIASTINUM: No acute abnormality of the cardiac and mediastinal silhouettes. BONES AND SOFT TISSUES: No acute osseous abnormality. IMPRESSION: 1. No acute cardiopulmonary abnormality . Electronically signed by: Rogelia Myers MD 09/29/2024 06:12 PM EST RP Workstation: [id number]   US  Abdomen Limited RUQ  (LIVER/GB) Result Date: 09/29/2024 EXAM: Right Upper Quadrant Abdominal Ultrasound 09/29/2024 06:04:09 PM TECHNIQUE: Real-time ultrasonography of the right upper quadrant of the abdomen was performed. COMPARISON: 02/15/2024 CLINICAL HISTORY: Right upper quadrant pain. FINDINGS: LIVER: Normal echogenicity. No intrahepatic biliary ductal dilatation. No evidence of mass. Hepatopetal flow in the portal vein. BILIARY SYSTEM: No cholecystolithiasis or changes of acute cholecystitis. Negative sonographic Murphy's sign. Common bile duct is within normal limits measuring  mm. OTHER: No right upper quadrant ascites. IMPRESSION: 1. No cholecystolithiasis or changes of acute cholecystitis. Electronically signed by: Rogelia Myers MD 09/29/2024 06:12 PM EST RP Workstation: [id number]    Pertinent labs & imaging results that were available during my care of the patient were reviewed by me and considered in my medical decision making (see MDM for details).  Medications Ordered in ED Medications  pantoprazole  (PROTONIX ) EC tablet 40 mg (40 mg Oral Given 09/29/24 1731)  alum & mag hydroxide-simeth (MAALOX/MYLANTA) 200-200-20 MG/5ML suspension 15 mL (15 mLs Oral Given 09/29/24 1731)                                                                                                                                     Procedures Procedures  (including critical care time)  Medical Decision Making / ED Course   This patient presents to the ED for concern of feeling unwell following influenza, epigastric pain and burning, this involves an extensive number of treatment options, and is a complaint that carries with it a high risk of complications and morbidity.  The differential diagnosis includes viral syndrome, pregnancy, consider STI, gastritis, biliary colic, less likely ACS, less likely myocarditis.  MDM: Patient overall looks quite well.  She has normal vital signs, no tachycardia.  I have lower suspicion for disease  process such as myocarditis given patient's physical exam and vital signs.  Will check blood work on the patient, EKG, obtain right upper quadrant ultrasound.  Pregnancy test and STI testing ordered per patient's request.  Will provide her with some Maalox and pantoprazole .  Reassessment 6:35 PM-patient's blood work overall normal.  She has chronic anemia, no significant changes from prior.  LFTs normal.  Right upper quadrant ultrasound negative, chest x-ray negative.  My independent review of the patient's EKG shows no ST segment depressions or elevations, no T wave inversions, no evidence of acute ischemia.  Will discharge.   Additional history obtained:  -External records from outside source obtained and reviewed including: Chart review including previous notes, labs, imaging, consultation notes   Lab Tests: -I ordered, reviewed,  and interpreted labs.   The pertinent results include:   Labs Reviewed  URINALYSIS, W/ REFLEX TO CULTURE (INFECTION SUSPECTED) - Abnormal; Notable for the following components:      Result Value   APPearance HAZY (*)    Specific Gravity, Urine 1.031 (*)    Ketones, ur 5 (*)    Protein, ur 30 (*)    Leukocytes,Ua TRACE (*)    All other components within normal limits  CBC WITH DIFFERENTIAL/PLATELET - Abnormal; Notable for the following components:   Hemoglobin 9.8 (*)    HCT 33.5 (*)    MCV 66.6 (*)    MCH 19.5 (*)    MCHC 29.3 (*)    RDW 17.3 (*)    Platelets 488 (*)    All other components within normal limits  COMPREHENSIVE METABOLIC PANEL WITH GFR  RAPID HIV SCREEN (HIV 1/2 AB+AG)  PREGNANCY, URINE  LIPASE, BLOOD  CBC WITH DIFFERENTIAL/PLATELET  SYPHILIS: RPR W/REFLEX TO RPR TITER AND TREPONEMAL ANTIBODIES, TRADITIONAL SCREENING AND DIAGNOSIS ALGORITHM  GC/CHLAMYDIA PROBE AMP ([place]) NOT AT [facility]      EKG my independent review of the patient's EKG shows no ST segment depressions or elevations, no T wave inversions, no evidence of  acute ischemia.  EKG Interpretation Date/Time:  Thursday September 29 2024 18:34:23 EST Ventricular Rate:  71 PR Interval:  152 QRS Duration:  89 QT Interval:  413 QTC Calculation: 449 R Axis:   56  Text Interpretation: Sinus rhythm Confirmed by Mannie Pac [phone]) on 09/29/2024 6:38:21 PM         Imaging Studies ordered: I ordered imaging studies including chest x-ray I independently visualized and interpreted imaging. I agree with the radiologist interpretation   Medicines ordered and prescription drug management: Meds ordered this encounter  Medications   pantoprazole  (PROTONIX ) EC tablet 40 mg   alum & mag hydroxide-simeth (MAALOX/MYLANTA) 200-200-20 MG/5ML suspension 15 mL   famotidine  (PEPCID ) 40 MG tablet    Sig: Take 1 tablet (40 mg total) by mouth daily.    Dispense:  30 tablet    Refill:  0    -I have reviewed the patients home medicines and have made adjustments as needed   Cardiac Monitoring: The patient was maintained on a cardiac monitor.  I personally viewed and interpreted the cardiac monitored which showed an underlying rhythm of: Normal sinus rhythm  Social Determinants of Health:  Factors impacting patients care include: Lack of access to primary care   Reevaluation: After the interventions noted above, I reevaluated the patient and found that they have :improved  Co morbidities that complicate the patient evaluation  Past Medical History:  Diagnosis Date   Asthma    Seasonal allergies       Dispostion: I considered admission for this patient, however very reassuring workup she is appropriate for outpatient management.     Final Clinical Impression(s) / ED Diagnoses Final diagnoses:  Viral illness  Acute gastritis without hemorrhage, unspecified gastritis type     @PCDICTATION @     [1]  Social History Tobacco Use   Smoking status: Never    Passive exposure: Yes   Smokeless tobacco: Never   Tobacco comments:    mom  smokes outside  Vaping Use   Vaping status: Never Used  Substance Use Topics   Alcohol use: Yes    Comment: socially   Drug use: Yes    Types: Marijuana     Mannie Pac DASEN, DO 09/29/24 1840  "

## 2024-09-29 NOTE — Telephone Encounter (Signed)
 FYI Only or Action Required?: FYI only for provider: appointment scheduled on 09/29/24.  Patient was last seen in primary care on 06/14/2024 by Mayers, Kirk RAMAN, PA-C.  Called Nurse Triage reporting Influenza, Abdominal Pain, Dizziness, Vomiting, and Headache.  Symptoms began several days ago.  Interventions attempted: OTC medications: Zofran  and Rest, hydration, or home remedies.  Symptoms are: gradually worsening.  Triage Disposition: See HCP Within 4 Hours (Or PCP Triage)  Patient/caregiver understands and will follow disposition?: Yes     Message from Pinkey ORN sent at 09/29/2024  7:48 AM EST  Reason for Triage: severe / intense stomach pain + light headedness.   Reason for Disposition  MILD difficulty breathing (e.g., minimal/no SOB at rest, SOB with walking, pulse < 100)  Answer Assessment - Initial Assessment Questions ED 09/26/24 [facility] if it got worse call ED, called and they told her to follow up with PCP. No chest pain, neck stiffness, diarrhea. Able to keep down 5 water bottles and a cup of water, Pedialyte and is urinating this AM.   1. DIAGNOSIS CONFIRMATION: When was the influenza diagnosed? By whom? Did you get a test for it?     ED on 09/26/24.  2. INFLUENZA MEDICINES: Were you prescribed any medicines for the influenza?  (e.g., zanamivir [Relenza], oseltamivir  [Tamiflu ]).      Zofran - ODT  3. SYMPTOMS: What is your main symptom or concern? (e.g., cough, fever, shortness of breath, muscle aches)     Vomiting (bile/ mucous green stuff) 5 episodes in the past 24 hours, burning upper abdominal pain (7/10), left back headache, lightheaded when standing or lying on back.  4. ONSET: When did the symptoms start?      Sneezing started 09/23/24  5. COUGH: Do you have a cough? If Yes, ask: How bad is the cough?       Yes, occasional.  6. FEVER: Do you have a fever? If Yes, ask: What is your temperature, how was it measured, and when did  it start?     Fever at ED on 09/26/24 and doesn't have a thermometer to recheck.  7. BREATHING DIFFICULTY: Are you having any difficulty breathing? (e.g., normal; shortness of breath, wheezing, unable to speak)      SOB at rest lying down or with activity. No wheezing.  8. PREGNANCY: Is there any chance you are pregnant? When was your last menstrual period?     09/23/24.  9. HIGH RISK FOR COMPLICATIONS: Do you have any chronic medical problems? (e.g., asthma, heart or lung disease, obesity, weak immune system)     Asthma.  10. PREGNANCY: Is there any chance you are pregnant? When was your last menstrual period?       Duplicate. N/A.  11. O2 SATURATION MONITOR:  Do you use an oxygen saturation monitor (pulse oximeter) at home? If Yes, ask What is your reading (oxygen level) today? What is your usual oxygen saturation reading? (e.g., 95%)       No.  Protocols used: Influenza (Flu) Follow-up Call-A-AH

## 2024-09-29 NOTE — Progress Notes (Signed)
 " [facility]  Rhonda Sims, is a 24 y.o. female  RDW:[id number]  FMW:[id number]  DOB - 2002/10/21Flu  Subjective:   Rhonda Sims is a 24 y.o. female here today for an acute visit. Positive for Flu 09/26/24 seen at Emergency room Today she feels no better complains of productive cough, congestion, myalgias, nausea, vomiting ( this occurred in the office today. # days post results will treat for since Friday night. She works at [employer]. Taking antipyretics for fever and chills.    No problems updated.  Comprehensive ROS Pertinent positive and negative noted in HPI   Allergies[1]  Past Medical History:  Diagnosis Date   Asthma    Seasonal allergies     Medications Ordered Prior to Encounter[2] Health Maintenance  Topic Date Due   HPV Vaccine (1 - 3-dose series) Never done   Meningitis B Vaccine (1 of 2 - Standard) Never done   DTaP/Tdap/Td vaccine (1 - Tdap) Never done   Pneumococcal Vaccine (1 of 2 - PCV) Never done   Hepatitis B Vaccine (1 of 3 - 19+ 3-dose series) Never done   Flu Shot  Never done   COVID-19 Vaccine (1 - 2025-26 season) Never done   Chlamydia screening  08/08/2025   Pap Smear  08/11/2026   Hepatitis C Screening  Completed   HIV Screening  Completed    Objective:   There were no vitals filed for this visit.   Physical Exam Vitals reviewed.  Constitutional:      Appearance: Normal appearance. She is obese. She is ill-appearing.  HENT:     Head: Normocephalic.     Right Ear: Tympanic membrane, ear canal and external ear normal.     Left Ear: Tympanic membrane, ear canal and external ear normal.     Nose: Congestion present.     Comments: Right > left red swollen boggy    Mouth/Throat:     Mouth: Mucous membranes are moist.  Eyes:     Extraocular Movements: Extraocular movements intact.  Cardiovascular:     Rate and Rhythm: Normal rate and regular rhythm.  Pulmonary:     Effort: Pulmonary effort is normal.      Breath sounds: Wheezing present.     Comments: RLL auscultated wheezing  Abdominal:     General: Bowel sounds are normal. There is distension.     Palpations: Abdomen is soft.  Musculoskeletal:        General: Normal range of motion.     Cervical back: Normal range of motion and neck supple.  Skin:    General: Skin is warm and dry.  Neurological:     Mental Status: She is oriented to person, place, and time.  Psychiatric:        Mood and Affect: Mood normal.        Behavior: Behavior normal.        Thought Content: Thought content normal.      Assessment & Plan  Diagnoses and all orders for this visit:  Influenza with respiratory manifestation -     albuterol  (VENTOLIN  HFA) 108 (90 Base) MCG/ACT inhaler; Inhale 2 puffs into the lungs every 6 (six) hours as needed for wheezing or shortness of breath.  fluticasone  (FLONASE ) 50 MCG/ACT nasal spray; Place 2 sprays into both nostrils daily.  Nasal congestion  fluticasone  (FLONASE ) 50 MCG/ACT nasal spray; Place 2 sprays into both nostrils daily.   Bronchospasm -     albuterol  (VENTOLIN  HFA) 108 (90 Base) MCG/ACT inhaler; Inhale  2 puffs into the lungs every 6 (six) hours as needed for wheezing or shortness of breath.  Other orders -     oseltamivir  (TAMIFLU ) 75 MG capsule; Take 1 capsule (75 mg total) by mouth 2 (two) times daily. -     fluticasone  (FLONASE ) 50 MCG/ACT nasal spray; Place 2 sprays into both nostrils daily.     Patient have been counseled extensively about nutrition and exercise. Other issues discussed during this visit include: low cholesterol diet, weight control and daily exercise, foot care, annual eye examinations at Ophthalmology, importance of adherence with medications and regular follow-up. We also discussed long term complications of uncontrolled diabetes and hypertension.   No follow-ups on file.  The patient was given clear instructions to go to ER or return to medical center if symptoms don't improve,  worsen or new problems develop. The patient verbalized understanding. The patient was told to call to get lab results if they haven't heard anything in the next week.   This note has been created with Education officer, environmental. Any transcriptional errors are unintentional.   Rhonda SHAUNNA Bohr, NP 09/29/2024, 2:05 PM     [1]  Allergies Allergen Reactions   Peanut-Containing Drug Products Itching   Shellfish Allergy Hives  [2]  Current Outpatient Medications on File Prior to Visit  Medication Sig Dispense Refill   cyclobenzaprine  (FLEXERIL ) 10 MG tablet Take 1 tablet (10 mg total) by mouth at bedtime. (Patient not taking: Reported on 08/08/2024) 7 tablet 0   methocarbamol  (ROBAXIN ) 500 MG tablet Take 1 tablet (500 mg total) by mouth every morning. 7 tablet 0   naproxen  (NAPROSYN ) 500 MG tablet Take 1 tablet (500 mg total) by mouth 2 (two) times daily with a meal. 20 tablet 0   nystatin  cream (MYCOSTATIN ) Apply to affected area 2 times daily 30 g 0   ondansetron  (ZOFRAN -ODT) 4 MG disintegrating tablet Take 1 tablet (4 mg total) by mouth every 8 (eight) hours as needed. 20 tablet 0   pantoprazole  (PROTONIX ) 40 MG tablet Take 1 tablet (40 mg total) by mouth daily. 14 tablet 0   traZODone  (DESYREL ) 50 MG tablet Take 1 tablet (50 mg total) by mouth at bedtime. 30 tablet 2   No current facility-administered medications on file prior to visit.   "

## 2024-09-30 ENCOUNTER — Encounter (INDEPENDENT_AMBULATORY_CARE_PROVIDER_SITE_OTHER): Payer: Self-pay | Admitting: Primary Care

## 2024-09-30 LAB — SYPHILIS: RPR W/REFLEX TO RPR TITER AND TREPONEMAL ANTIBODIES, TRADITIONAL SCREENING AND DIAGNOSIS ALGORITHM: RPR Ser Ql: NONREACTIVE

## 2024-09-30 LAB — GC/CHLAMYDIA PROBE AMP (~~LOC~~) NOT AT ARMC
Chlamydia: NEGATIVE
Comment: NEGATIVE
Comment: NORMAL
Neisseria Gonorrhea: NEGATIVE

## 2024-09-30 NOTE — Telephone Encounter (Signed)
 Report to Emergency Department/Urgent Care/call 911 for immediate medical evaluation. Follow-up with Primary Care.

## 2024-10-04 ENCOUNTER — Encounter (INDEPENDENT_AMBULATORY_CARE_PROVIDER_SITE_OTHER): Payer: Self-pay | Admitting: Primary Care
